# Patient Record
Sex: Female | Born: 1935
Health system: Southern US, Community
[De-identification: ages and names within clinical notes are randomized; demographics above are authoritative.]

## PROBLEM LIST (undated history)

## (undated) DIAGNOSIS — M1990 Unspecified osteoarthritis, unspecified site: Secondary | ICD-10-CM

## (undated) DIAGNOSIS — Z87442 Personal history of urinary calculi: Secondary | ICD-10-CM

## (undated) DIAGNOSIS — N189 Chronic kidney disease, unspecified: Secondary | ICD-10-CM

## (undated) DIAGNOSIS — Z9889 Other specified postprocedural states: Secondary | ICD-10-CM

## (undated) DIAGNOSIS — F419 Anxiety disorder, unspecified: Secondary | ICD-10-CM

## (undated) DIAGNOSIS — I10 Essential (primary) hypertension: Secondary | ICD-10-CM

## (undated) HISTORY — DX: Unspecified osteoarthritis, unspecified site: M19.90

## (undated) HISTORY — PX: BREAST LUMPECTOMY: SHX2

## (undated) HISTORY — PX: OTHER SURGICAL HISTORY: SHX169

## (undated) HISTORY — PX: CYSTOSCOPY W/ STONE MANIPULATION: SHX1427

## (undated) HISTORY — DX: Chronic kidney disease, unspecified: N18.9

## (undated) HISTORY — PX: ABDOMINAL HYSTERECTOMY: SHX81

## (undated) HISTORY — DX: Anxiety disorder, unspecified: F41.9

## (undated) HISTORY — DX: Other specified postprocedural states: Z98.890

## (undated) HISTORY — DX: Essential (primary) hypertension: I10

---

## 1999-11-30 ENCOUNTER — Encounter: Payer: Self-pay | Admitting: Urology

## 1999-11-30 ENCOUNTER — Encounter: Admission: RE | Admit: 1999-11-30 | Discharge: 1999-11-30 | Payer: Self-pay | Admitting: Dermatology

## 1999-12-03 ENCOUNTER — Ambulatory Visit (HOSPITAL_COMMUNITY): Admission: RE | Admit: 1999-12-03 | Discharge: 1999-12-03 | Payer: Self-pay | Admitting: Urology

## 1999-12-03 ENCOUNTER — Encounter: Payer: Self-pay | Admitting: Urology

## 2001-02-10 ENCOUNTER — Encounter: Payer: Self-pay | Admitting: *Deleted

## 2001-02-10 ENCOUNTER — Ambulatory Visit (HOSPITAL_COMMUNITY): Admission: RE | Admit: 2001-02-10 | Discharge: 2001-02-10 | Payer: Self-pay | Admitting: *Deleted

## 2001-02-17 ENCOUNTER — Ambulatory Visit (HOSPITAL_COMMUNITY): Admission: RE | Admit: 2001-02-17 | Discharge: 2001-02-17 | Payer: Self-pay | Admitting: Internal Medicine

## 2001-03-24 ENCOUNTER — Encounter: Admission: RE | Admit: 2001-03-24 | Discharge: 2001-03-24 | Payer: Self-pay | Admitting: Urology

## 2001-03-24 ENCOUNTER — Encounter: Payer: Self-pay | Admitting: Urology

## 2001-06-20 ENCOUNTER — Ambulatory Visit (HOSPITAL_COMMUNITY): Admission: RE | Admit: 2001-06-20 | Discharge: 2001-06-20 | Payer: Self-pay | Admitting: Family Medicine

## 2001-06-20 ENCOUNTER — Encounter: Payer: Self-pay | Admitting: Family Medicine

## 2002-02-02 ENCOUNTER — Ambulatory Visit (HOSPITAL_COMMUNITY): Admission: RE | Admit: 2002-02-02 | Discharge: 2002-02-02 | Payer: Self-pay | Admitting: Internal Medicine

## 2002-02-02 ENCOUNTER — Encounter: Payer: Self-pay | Admitting: Internal Medicine

## 2002-06-20 ENCOUNTER — Ambulatory Visit (HOSPITAL_COMMUNITY): Admission: RE | Admit: 2002-06-20 | Discharge: 2002-06-20 | Payer: Self-pay | Admitting: Internal Medicine

## 2002-06-20 ENCOUNTER — Encounter: Payer: Self-pay | Admitting: Internal Medicine

## 2003-11-01 ENCOUNTER — Ambulatory Visit (HOSPITAL_COMMUNITY): Admission: RE | Admit: 2003-11-01 | Discharge: 2003-11-01 | Payer: Self-pay | Admitting: Family Medicine

## 2003-11-04 ENCOUNTER — Ambulatory Visit (HOSPITAL_COMMUNITY): Admission: RE | Admit: 2003-11-04 | Discharge: 2003-11-04 | Payer: Self-pay | Admitting: Family Medicine

## 2004-01-09 ENCOUNTER — Ambulatory Visit (HOSPITAL_COMMUNITY): Admission: RE | Admit: 2004-01-09 | Discharge: 2004-01-09 | Payer: Self-pay | Admitting: Internal Medicine

## 2004-05-03 ENCOUNTER — Emergency Department (HOSPITAL_COMMUNITY): Admission: EM | Admit: 2004-05-03 | Discharge: 2004-05-03 | Payer: Self-pay | Admitting: Emergency Medicine

## 2004-05-15 ENCOUNTER — Ambulatory Visit (HOSPITAL_BASED_OUTPATIENT_CLINIC_OR_DEPARTMENT_OTHER): Admission: RE | Admit: 2004-05-15 | Discharge: 2004-05-15 | Payer: Self-pay | Admitting: Urology

## 2004-05-15 ENCOUNTER — Ambulatory Visit (HOSPITAL_COMMUNITY): Admission: RE | Admit: 2004-05-15 | Discharge: 2004-05-15 | Payer: Self-pay | Admitting: Urology

## 2005-12-07 ENCOUNTER — Ambulatory Visit (HOSPITAL_COMMUNITY): Admission: RE | Admit: 2005-12-07 | Discharge: 2005-12-07 | Payer: Self-pay | Admitting: Family Medicine

## 2005-12-08 ENCOUNTER — Ambulatory Visit (HOSPITAL_COMMUNITY): Admission: RE | Admit: 2005-12-08 | Discharge: 2005-12-08 | Payer: Self-pay | Admitting: Family Medicine

## 2006-01-17 ENCOUNTER — Emergency Department (HOSPITAL_COMMUNITY): Admission: EM | Admit: 2006-01-17 | Discharge: 2006-01-17 | Payer: Self-pay | Admitting: Emergency Medicine

## 2006-03-30 DIAGNOSIS — Z9889 Other specified postprocedural states: Secondary | ICD-10-CM

## 2006-03-30 HISTORY — DX: Other specified postprocedural states: Z98.890

## 2006-04-13 ENCOUNTER — Ambulatory Visit (HOSPITAL_COMMUNITY): Admission: RE | Admit: 2006-04-13 | Discharge: 2006-04-13 | Payer: Self-pay | Admitting: Gastroenterology

## 2006-04-13 ENCOUNTER — Ambulatory Visit: Payer: Self-pay | Admitting: Gastroenterology

## 2006-12-20 ENCOUNTER — Emergency Department (HOSPITAL_COMMUNITY): Admission: EM | Admit: 2006-12-20 | Discharge: 2006-12-20 | Payer: Self-pay | Admitting: Emergency Medicine

## 2006-12-27 ENCOUNTER — Ambulatory Visit (HOSPITAL_COMMUNITY): Admission: RE | Admit: 2006-12-27 | Discharge: 2006-12-27 | Payer: Self-pay | Admitting: Family Medicine

## 2008-01-18 ENCOUNTER — Ambulatory Visit (HOSPITAL_COMMUNITY): Admission: RE | Admit: 2008-01-18 | Discharge: 2008-01-18 | Payer: Self-pay | Admitting: Family Medicine

## 2009-02-04 ENCOUNTER — Ambulatory Visit (HOSPITAL_COMMUNITY): Admission: RE | Admit: 2009-02-04 | Discharge: 2009-02-04 | Payer: Self-pay | Admitting: Family Medicine

## 2010-02-06 ENCOUNTER — Ambulatory Visit (HOSPITAL_COMMUNITY): Admission: RE | Admit: 2010-02-06 | Discharge: 2010-02-06 | Payer: Self-pay | Admitting: Family Medicine

## 2011-01-15 NOTE — Op Note (Signed)
NAMEJIMIA, Claire Barnett                 ACCOUNT NO.:  [id number]   MEDICAL RECORD NO.:  [id number]          PATIENT TYPE:  AMB   LOCATION:  DAY                           FACILITY:  [facility]   PHYSICIAN:  Kassie Mends, M.D.      DATE OF BIRTH:  Sep 16, 1935   DATE OF PROCEDURE:  04/13/2006  DATE OF DISCHARGE:                                  PROCEDURE NOTE   PROCEDURE:  Colonoscopy.   INDICATION FOR EXAM:  [name] is a 75 year old female who has a first-  degree relative with colon cancer at an age less than 45.  She also has a  personal history of polyps.  Her last colonoscopy was in 2002.   MEDICATIONS:  1. Demerol 50 mg IV.  2. Versed 4 mg IV.  3. Phenergan 12.5 mg IV.   FINDINGS:  1. Rare sigmoid diverticulosis.  Otherwise, no polyps, inflammatory      changes, masses or vascular ectasia seen.  2. Normal retroflexed view of the rectum.   RECOMMENDATIONS:  1. Screening colonoscopy in 5 years.  2. Recommend high fiber diet.  She is given a handout on diverticulosis      and high fiber diet.  3. Follow with Dr. Phillips Odor.   PROCEDURE TECHNIQUE:  The physical exam was performed and informed consent  was obtained from the patient, after explaining all risks, benefits and  alternatives to the procedure.  The patient was connected to monitoring  device and placed in left lateral position.  Continuous oxygen was provided  by nasal cannula and IV medicine administered through an indwelling cannula.  After administration of sedation and a rectal exam, the  patient's rectum was intubated, and the scope was advanced, under direct  visualization to the cecum.  The scope was subsequently removed slowly,  carefully examining the color, texture, anatomy and integrity of the mucosa  on the way out.  The patient was recovered in the endoscopy suite, and  discharged to the floor in satisfactory condition.      Kassie Mends, M.D.  Electronically Signed     SM/MEDQ  D:  04/13/2006  T:   04/13/2006  Job:  [id number]   cc:   Corrie Mckusick, M.D.  Fax: [phone]

## 2011-01-15 NOTE — Op Note (Signed)
NAME:  Claire Barnett, Claire Barnett                           ACCOUNT NO.:  [id number]   MEDICAL RECORD NO.:  [id number]                   PATIENT TYPE:  AMB   LOCATION:  [facility]                                 FACILITY:  [facility]   PHYSICIAN:  Ronald L. Ovidio Hanger, M.D.           DATE OF BIRTH:  11-13-1935   DATE OF PROCEDURE:  05/15/2004  DATE OF DISCHARGE:                                 OPERATIVE REPORT   PREOPERATIVE DIAGNOSES:  Bilateral hydroureteronephrosis, right  ureterolithiasis.   POSTOPERATIVE DIAGNOSES:  Bilateral hydroureteronephrosis, right  ureterolithiasis.   OPERATION:  Cystourethroscopy, right ureteral stone extraction, bilateral  retrograde ureteropyelogram.   SURGEON:  Ronald L. Earlene Plater, M.D.   ANESTHESIA:  LMA.   ESTIMATED BLOOD LOSS:  Negligible.   TUBES:  None.   COMPLICATIONS:  None.   INDICATIONS FOR PROCEDURE:  [name] is a lovely 75 year old black female  who presented with right flank pain. She had undergone a CT scan on  September 4 which revealed a 7.5 x 4.7 mm right distal ureteral calculus  with moderate high grade obstruction.  She has had intermittent mild right  flank pain but no left flank pain and history of bilateral nephrolithiasis.  Bilateral renal ultrasound on May 12, 2004 revealed mild  hydroureteronephrosis bilaterally and with this situation of concern and  felt that the  above procedure was indicated.   DESCRIPTION OF PROCEDURE:  The patient was placed in supine position and  after proper LMA anesthesia was placed in the dorsal lithotomy position,  prepped and draped with Betadine in a sterile fashion.  Cystourethroscopy  was performed with a 22.5 [place] Olympus panendoscope. Utilizing the 12 and  70 degree lenses, the bladder was carefully inspected. It was smooth walled  and noted from the right ureteral orifices was a large yellowish protruding  stone.  The left ureteral orifice was noted to be essentially normal.  Under  direct  guidance, the stone was grasped with a grasping forceps and removed  totally intact and a hydronephrotic drop was noted from the right ureteral  orifice. The stone will be submitted for stone analysis.  Right retrograde  ureteropyelogram was performed with a 6 [place] open ended catheter and  entire collecting system on the right side noted to be full due to the stone  but there were no obstruction filling defects.  The left ureteral orifice  was then approached, it was noted to be somewhat small but the 6 [place] open  ended catheter was passed up to the kidney and a left retrograde  ureteropyelogram was performed and there was a relative UPJ obstruction on  the left side which appeared to be chronic and most likely was the cause for  the fullness on the ultrasound but there were no  ureteral abnormalities, obstruction, filling defects, etc.  Both systems  appeared to drain well. The bladder was drained, the panendoscope was  removed and the patient  was taken to the recovery room stable. It was felt  that ureteral stents were probably not indicated in this case.      RLD/MEDQ  D:  05/15/2004  T:  05/16/2004  Job:  [id number]

## 2011-04-01 ENCOUNTER — Encounter: Payer: Self-pay | Admitting: Gastroenterology

## 2011-04-12 ENCOUNTER — Other Ambulatory Visit (HOSPITAL_COMMUNITY): Payer: Self-pay | Admitting: Internal Medicine

## 2011-04-12 DIAGNOSIS — Z139 Encounter for screening, unspecified: Secondary | ICD-10-CM

## 2011-04-15 ENCOUNTER — Encounter: Payer: Self-pay | Admitting: Gastroenterology

## 2011-04-15 ENCOUNTER — Ambulatory Visit (INDEPENDENT_AMBULATORY_CARE_PROVIDER_SITE_OTHER): Payer: Medicare Other | Admitting: Gastroenterology

## 2011-04-15 VITALS — BP 140/79 | HR 71 | Temp 98.0°F | Ht 60.0 in | Wt 147.6 lb

## 2011-04-15 DIAGNOSIS — Z800 Family history of malignant neoplasm of digestive organs: Secondary | ICD-10-CM

## 2011-04-15 NOTE — Progress Notes (Signed)
Cc to PCP 

## 2011-04-15 NOTE — Progress Notes (Signed)
Primary Care Physician:  Kirk Ruths, MD Primary Gastroenterologist:  Dr. Darrick Penna   Chief Complaint  Patient presents with  . Colonoscopy    HPI:   Ms. Claire Barnett is a pleasant 75 year old female who appears younger than stated age. She presents today for an updated colonoscopy, last done in 2007. She has a personal history of polyps in the remote past, as well as 2 first-degree relatives with a hx of colon cancer diagnosed at age <50. She is doing well today. Denies abdominal pain, change in bowel habits, nausea or vomiting. She reports remote hx of brbpr. This has not happened recently.    Past Medical History  Diagnosis Date  . S/P colonoscopy August 2007    Rare sigmoid diverticulosis.  Otherwise, no polyps, inflammatory  . Hypertension   . Anxiety     Past Surgical History  Procedure Date  . Breast lumpectomy     benign  . Kidney stones     X 4 procedures  . Abdominal hysterectomy     Current Outpatient Prescriptions  Medication Sig Dispense Refill  . alprazolam (XANAX) 2 MG tablet Take 2 mg by mouth at bedtime as needed.        Marland Kitchen atenolol (TENORMIN) 50 MG tablet Take 50 mg by mouth daily.       . hydrochlorothiazide 25 MG tablet Take 25 mg by mouth daily.         Allergies as of 04/15/2011  . (No Known Allergies)    Family History  Problem Relation Age of Onset  . Colon cancer Sister     diagnosed late 53s.   . Colon cancer Mother     diagnosed in mid to late 81s. Deceased age 30    History   Social History  . Marital Status: Married    Spouse Name: N/A    Number of Children: N/A  . Years of Education: N/A   Occupational History  . retired     was a Neurosurgeon   Social History Main Topics  . Smoking status: Never Smoker   . Smokeless tobacco: Not on file  . Alcohol Use: No  . Drug Use: No  . Sexually Active: Not on file    Review of Systems: Gen: Denies any fever, chills, fatigue, weight loss, lack of appetite.  CV: Denies chest pain,  heart palpitations, peripheral edema, syncope.  Resp: Denies shortness of breath at rest or with exertion. Denies wheezing or cough.  GI: Denies dysphagia or odynophagia. Denies jaundice, hematemesis, fecal incontinence. GU : Denies urinary burning, urinary frequency, urinary hesitancy MS: Denies joint pain, muscle weakness, cramps, or limitation of movement.  Derm: Denies rash, itching, dry skin Psych: Denies depression, anxiety, memory loss, and confusion Heme: Denies bruising, bleeding, and enlarged lymph nodes.  Physical Exam: BP 140/79  Pulse 71  Temp(Src) 98 F (36.7 C) (Temporal)  Ht 5' (1.524 m)  Wt 147 lb 9.6 oz (66.951 kg)  BMI 28.83 kg/m2 General:   Alert and oriented. Pleasant and cooperative. Well-nourished and well-developed. Appears younger than stated age.  Head:  Normocephalic and atraumatic. Eyes:  Without icterus, sclera clear and conjunctiva pink.  Ears:  Normal auditory acuity. Nose:  No deformity, discharge,  or lesions. Mouth:  No deformity or lesions, oral mucosa pink.  Neck:  Supple, without mass or thyromegaly. Lungs:  Clear to auscultation bilaterally. No wheezes, rales, or rhonchi. No distress.  Heart:  S1, S2 present without murmurs appreciated.  Abdomen:  +BS, soft, non-tender and  non-distended. No HSM noted. No guarding or rebound. No masses appreciated.  Rectal:  Deferred  Msk:  Symmetrical without gross deformities. Normal posture. Extremities:  Without clubbing or edema. Neurologic:  Alert and  oriented x4;  grossly normal neurologically. Skin:  Intact without significant lesions or rashes. Cervical Nodes:  No significant cervical adenopathy. Psych:  Alert and cooperative. Normal mood and affect.

## 2011-04-15 NOTE — Patient Instructions (Signed)
We have set you up for a colonoscopy with Dr. Darrick Penna.  No changes to your medication currently.  Further recommendations after procedure is completed.   Happy early [place] Ouch!!!!

## 2011-04-15 NOTE — Assessment & Plan Note (Signed)
75 year old pleasant female who appears younger than stated age, presenting for updated colonoscopy. Last done in 2007. Family hx of colon cancer in 2 first-degree relatives, both diagnosed in late 17s, and a personal hx of polyps. She is doing well without any GI symptoms except for a remote hx of mild brbpr. No other issues reported.   Proceed with colonoscopy with Dr. Darrick Penna in the near future. The risks, benefits, and alternatives have been discussed in detail with the patient. They state understanding and desire to proceed.

## 2011-04-15 NOTE — Progress Notes (Signed)
Addended by: Jennings Books on: 04/15/2011 09:31 AM   Modules accepted: Orders

## 2011-04-20 ENCOUNTER — Ambulatory Visit (HOSPITAL_COMMUNITY)
Admission: RE | Admit: 2011-04-20 | Discharge: 2011-04-20 | Disposition: A | Discharge status: Home / Self Care | Payer: Medicare Other | Source: Ambulatory Visit | Attending: Internal Medicine | Admitting: Internal Medicine

## 2011-04-20 DIAGNOSIS — Z1231 Encounter for screening mammogram for malignant neoplasm of breast: Secondary | ICD-10-CM | POA: Insufficient documentation

## 2011-04-20 DIAGNOSIS — Z139 Encounter for screening, unspecified: Secondary | ICD-10-CM

## 2011-04-23 ENCOUNTER — Other Ambulatory Visit: Payer: Self-pay | Admitting: Internal Medicine

## 2011-04-23 DIAGNOSIS — R928 Other abnormal and inconclusive findings on diagnostic imaging of breast: Secondary | ICD-10-CM

## 2011-05-06 NOTE — Progress Notes (Signed)
PED SCOPE

## 2011-05-11 MED ORDER — SODIUM CHLORIDE 0.45 % IV SOLN: Freq: Once | INTRAVENOUS | Status: DC

## 2011-05-12 ENCOUNTER — Encounter (HOSPITAL_COMMUNITY): Payer: Self-pay | Admitting: *Deleted

## 2011-05-12 ENCOUNTER — Ambulatory Visit (HOSPITAL_COMMUNITY)
Admission: RE | Admit: 2011-05-12 | Discharge: 2011-05-12 | Disposition: A | Discharge status: Home / Self Care | Payer: Medicare Other | Source: Ambulatory Visit | Attending: Gastroenterology | Admitting: Gastroenterology

## 2011-05-12 ENCOUNTER — Encounter (HOSPITAL_COMMUNITY)
Admission: RE | Disposition: A | Discharge status: Home / Self Care | Payer: Self-pay | Source: Ambulatory Visit | Attending: Gastroenterology

## 2011-05-12 DIAGNOSIS — Z79899 Other long term (current) drug therapy: Secondary | ICD-10-CM | POA: Insufficient documentation

## 2011-05-12 DIAGNOSIS — Z860100 Personal history of colon polyps, unspecified: Secondary | ICD-10-CM | POA: Insufficient documentation

## 2011-05-12 DIAGNOSIS — K5730 Diverticulosis of large intestine without perforation or abscess without bleeding: Secondary | ICD-10-CM | POA: Insufficient documentation

## 2011-05-12 DIAGNOSIS — K648 Other hemorrhoids: Secondary | ICD-10-CM | POA: Insufficient documentation

## 2011-05-12 DIAGNOSIS — Z800 Family history of malignant neoplasm of digestive organs: Secondary | ICD-10-CM | POA: Insufficient documentation

## 2011-05-12 DIAGNOSIS — I10 Essential (primary) hypertension: Secondary | ICD-10-CM | POA: Insufficient documentation

## 2011-05-12 HISTORY — PX: COLONOSCOPY: SHX5424

## 2011-05-12 SURGERY — COLONOSCOPY
Anesthesia: Moderate Sedation

## 2011-05-12 MED ORDER — SODIUM CHLORIDE 0.9 % IJ SOLN
INTRAMUSCULAR | Status: AC
  Filled 2011-05-12: qty 20

## 2011-05-12 MED ORDER — PROMETHAZINE HCL 25 MG/ML IJ SOLN
INTRAMUSCULAR | Status: AC
  Filled 2011-05-12: qty 1

## 2011-05-12 MED ORDER — MEPERIDINE HCL 100 MG/ML IJ SOLN
INTRAMUSCULAR | Status: DC | PRN
  Administered 2011-05-12: 25 mg via INTRAVENOUS
  Administered 2011-05-12: 50 mg via INTRAVENOUS

## 2011-05-12 MED ORDER — MEPERIDINE HCL 100 MG/ML IJ SOLN
INTRAMUSCULAR | Status: AC
  Filled 2011-05-12: qty 2

## 2011-05-12 MED ORDER — PROMETHAZINE HCL 25 MG/ML IJ SOLN
INTRAMUSCULAR | Status: DC | PRN
  Administered 2011-05-12: 12.5 mg via INTRAVENOUS

## 2011-05-12 MED ORDER — MIDAZOLAM HCL 5 MG/5ML IJ SOLN
INTRAMUSCULAR | Status: AC
  Filled 2011-05-12: qty 10

## 2011-05-12 MED ORDER — MIDAZOLAM HCL 5 MG/5ML IJ SOLN
INTRAMUSCULAR | Status: DC | PRN
  Administered 2011-05-12 (×2): 2 mg via INTRAVENOUS

## 2011-05-12 NOTE — Interval H&P Note (Signed)
History and Physical Interval Note:   05/12/2011   10:47 AM   Claire Barnett  has presented today for surgery, with the diagnosis of SURV.  The various methods of treatment have been discussed with the patient and family. After consideration of risks, benefits and other options for treatment, the patient has consented to  Procedure(s): COLONOSCOPY as a surgical intervention .  I have reviewed the patients' chart and labs.  Questions were answered to the patient's satisfaction.     [name]  MD

## 2011-05-12 NOTE — H&P (Signed)
  In epic 8/16

## 2011-05-19 ENCOUNTER — Ambulatory Visit (HOSPITAL_COMMUNITY)
Admission: RE | Admit: 2011-05-19 | Discharge: 2011-05-19 | Disposition: A | Discharge status: Home / Self Care | Payer: Medicare Other | Source: Ambulatory Visit | Attending: Internal Medicine | Admitting: Internal Medicine

## 2011-05-19 ENCOUNTER — Other Ambulatory Visit (HOSPITAL_COMMUNITY): Payer: Self-pay | Admitting: Internal Medicine

## 2011-05-19 ENCOUNTER — Encounter (HOSPITAL_COMMUNITY): Payer: Self-pay | Admitting: Gastroenterology

## 2011-05-19 DIAGNOSIS — R928 Other abnormal and inconclusive findings on diagnostic imaging of breast: Secondary | ICD-10-CM

## 2011-05-19 DIAGNOSIS — N63 Unspecified lump in unspecified breast: Secondary | ICD-10-CM | POA: Insufficient documentation

## 2011-05-27 ENCOUNTER — Other Ambulatory Visit (HOSPITAL_COMMUNITY): Payer: Self-pay | Admitting: Internal Medicine

## 2011-05-27 DIAGNOSIS — Z139 Encounter for screening, unspecified: Secondary | ICD-10-CM

## 2011-06-02 ENCOUNTER — Ambulatory Visit (HOSPITAL_COMMUNITY)
Admission: RE | Admit: 2011-06-02 | Discharge: 2011-06-02 | Disposition: A | Discharge status: Home / Self Care | Payer: Medicare Other | Source: Ambulatory Visit | Attending: Internal Medicine | Admitting: Internal Medicine

## 2011-06-02 DIAGNOSIS — Z139 Encounter for screening, unspecified: Secondary | ICD-10-CM

## 2011-06-02 DIAGNOSIS — Z780 Asymptomatic menopausal state: Secondary | ICD-10-CM | POA: Insufficient documentation

## 2011-06-02 DIAGNOSIS — Z13820 Encounter for screening for osteoporosis: Secondary | ICD-10-CM | POA: Insufficient documentation

## 2012-06-14 ENCOUNTER — Other Ambulatory Visit (HOSPITAL_COMMUNITY): Payer: Self-pay | Admitting: Physician Assistant

## 2012-06-14 DIAGNOSIS — N6089 Other benign mammary dysplasias of unspecified breast: Secondary | ICD-10-CM

## 2012-06-28 ENCOUNTER — Ambulatory Visit (HOSPITAL_COMMUNITY)
Admission: RE | Admit: 2012-06-28 | Discharge: 2012-06-28 | Disposition: A | Discharge status: Home / Self Care | Payer: Medicare Other | Source: Ambulatory Visit | Attending: Physician Assistant | Admitting: Physician Assistant

## 2012-06-28 ENCOUNTER — Other Ambulatory Visit (HOSPITAL_COMMUNITY): Payer: Self-pay | Admitting: "Endocrinology

## 2012-06-28 DIAGNOSIS — N6009 Solitary cyst of unspecified breast: Secondary | ICD-10-CM | POA: Insufficient documentation

## 2012-06-28 DIAGNOSIS — N6089 Other benign mammary dysplasias of unspecified breast: Secondary | ICD-10-CM

## 2012-06-28 DIAGNOSIS — Z09 Encounter for follow-up examination after completed treatment for conditions other than malignant neoplasm: Secondary | ICD-10-CM | POA: Insufficient documentation

## 2012-06-28 DIAGNOSIS — E0590 Thyrotoxicosis, unspecified without thyrotoxic crisis or storm: Secondary | ICD-10-CM

## 2012-06-29 ENCOUNTER — Encounter (HOSPITAL_COMMUNITY): Payer: Medicare Other

## 2012-06-30 ENCOUNTER — Encounter (HOSPITAL_COMMUNITY): Payer: Medicare Other

## 2012-07-06 ENCOUNTER — Encounter (HOSPITAL_COMMUNITY): Payer: Medicare Other

## 2012-07-07 ENCOUNTER — Encounter (HOSPITAL_COMMUNITY): Payer: Medicare Other

## 2012-07-12 ENCOUNTER — Encounter (HOSPITAL_COMMUNITY)
Admission: RE | Admit: 2012-07-12 | Discharge: 2012-07-12 | Disposition: A | Discharge status: Home / Self Care | Payer: Medicare Other | Source: Ambulatory Visit | Attending: "Endocrinology | Admitting: "Endocrinology

## 2012-07-12 ENCOUNTER — Encounter (HOSPITAL_COMMUNITY): Payer: Self-pay

## 2012-07-12 DIAGNOSIS — E0590 Thyrotoxicosis, unspecified without thyrotoxic crisis or storm: Secondary | ICD-10-CM

## 2012-07-12 MED ORDER — SODIUM IODIDE I 131 CAPSULE
10.0000 | Freq: Once | INTRAVENOUS | Status: AC | PRN
  Administered 2012-07-12: 17 via ORAL

## 2012-07-13 ENCOUNTER — Encounter (HOSPITAL_COMMUNITY)
Admission: RE | Admit: 2012-07-13 | Discharge: 2012-07-13 | Disposition: A | Discharge status: Home / Self Care | Payer: Medicare Other | Source: Ambulatory Visit | Attending: "Endocrinology | Admitting: "Endocrinology

## 2012-07-13 ENCOUNTER — Encounter (HOSPITAL_COMMUNITY): Payer: Self-pay

## 2012-07-13 DIAGNOSIS — G479 Sleep disorder, unspecified: Secondary | ICD-10-CM | POA: Insufficient documentation

## 2012-07-13 DIAGNOSIS — R002 Palpitations: Secondary | ICD-10-CM | POA: Insufficient documentation

## 2012-07-13 DIAGNOSIS — E0520 Thyrotoxicosis with toxic multinodular goiter without thyrotoxic crisis or storm: Secondary | ICD-10-CM | POA: Insufficient documentation

## 2012-07-13 DIAGNOSIS — R5381 Other malaise: Secondary | ICD-10-CM | POA: Insufficient documentation

## 2012-07-13 MED ORDER — SODIUM PERTECHNETATE TC 99M INJECTION
10.0000 | Freq: Once | INTRAVENOUS | Status: AC | PRN
  Administered 2012-07-13: 9.4 via INTRAVENOUS

## 2012-07-25 ENCOUNTER — Other Ambulatory Visit (HOSPITAL_COMMUNITY): Payer: Self-pay | Admitting: "Endocrinology

## 2012-07-25 DIAGNOSIS — E0510 Thyrotoxicosis with toxic single thyroid nodule without thyrotoxic crisis or storm: Secondary | ICD-10-CM

## 2012-08-01 ENCOUNTER — Other Ambulatory Visit (HOSPITAL_COMMUNITY): Payer: Self-pay | Admitting: "Endocrinology

## 2012-08-01 ENCOUNTER — Ambulatory Visit (HOSPITAL_COMMUNITY)
Admission: RE | Admit: 2012-08-01 | Discharge: 2012-08-01 | Disposition: A | Discharge status: Home / Self Care | Payer: Medicare Other | Source: Ambulatory Visit | Attending: "Endocrinology | Admitting: "Endocrinology

## 2012-08-01 ENCOUNTER — Encounter (HOSPITAL_COMMUNITY)
Admission: RE | Admit: 2012-08-01 | Discharge: 2012-08-01 | Disposition: A | Discharge status: Home / Self Care | Payer: Medicare Other | Source: Ambulatory Visit | Attending: "Endocrinology | Admitting: "Endocrinology

## 2012-08-01 DIAGNOSIS — R220 Localized swelling, mass and lump, head: Secondary | ICD-10-CM | POA: Insufficient documentation

## 2012-08-01 DIAGNOSIS — E0510 Thyrotoxicosis with toxic single thyroid nodule without thyrotoxic crisis or storm: Secondary | ICD-10-CM

## 2012-08-01 DIAGNOSIS — R221 Localized swelling, mass and lump, neck: Secondary | ICD-10-CM

## 2012-08-01 DIAGNOSIS — E0520 Thyrotoxicosis with toxic multinodular goiter without thyrotoxic crisis or storm: Secondary | ICD-10-CM | POA: Insufficient documentation

## 2012-08-01 NOTE — Progress Notes (Signed)
Lidocaine 2%         2mL injected                 Bilateral thyroid biopsies performed  

## 2012-08-03 ENCOUNTER — Encounter (HOSPITAL_COMMUNITY): Payer: Self-pay

## 2012-08-03 ENCOUNTER — Encounter (HOSPITAL_COMMUNITY)
Admission: RE | Admit: 2012-08-03 | Discharge: 2012-08-03 | Disposition: A | Discharge status: Home / Self Care | Payer: Medicare Other | Source: Ambulatory Visit | Attending: "Endocrinology | Admitting: "Endocrinology

## 2012-08-03 DIAGNOSIS — E0580 Other thyrotoxicosis without thyrotoxic crisis or storm: Secondary | ICD-10-CM | POA: Insufficient documentation

## 2012-08-03 MED ORDER — SODIUM IODIDE I 131 CAPSULE
30.0000 | Freq: Once | INTRAVENOUS | Status: AC | PRN
  Administered 2012-08-03: 29.4 via ORAL

## 2013-01-19 ENCOUNTER — Encounter (HOSPITAL_COMMUNITY): Payer: Self-pay | Admitting: Pharmacy Technician

## 2013-01-19 NOTE — Patient Instructions (Addendum)
Your procedure is scheduled on: 02/01/2013   Report to [facility] at  720    AM.  Call this number if you have problems the morning of surgery: [phone]   Do not eat food or drink liquids :After Midnight.      Take these medicines the morning of surgery with A SIP OF WATER: xanax,tenormin,hydrochloarthiazide   Do not wear jewelry, make-up or nail polish.  Do not wear lotions, powders, or perfumes.   Do not shave 48 hours prior to surgery.  Do not bring valuables to the hospital.  Contacts, dentures or bridgework may not be worn into surgery.  Leave suitcase in the car. After surgery it may be brought to your room.  For patients admitted to the hospital, checkout time is 11:00 AM the day of discharge.   Patients discharged the day of surgery will not be allowed to drive home.  :     Please read over the following fact sheets that you were given: Coughing and Deep Breathing, Surgical Site Infection Prevention, Anesthesia Post-op Instructions and Care and Recovery After Surgery    Cataract A cataract is a clouding of the lens of the eye. When a lens becomes cloudy, vision is reduced based on the degree and nature of the clouding. Many cataracts reduce vision to some degree. Some cataracts make people more near-sighted as they develop. Other cataracts increase glare. Cataracts that are ignored and become worse can sometimes look white. The white color can be seen through the pupil. CAUSES   Aging. However, cataracts may occur at any age, even in newborns.   Certain drugs.   Trauma to the eye.   Certain diseases such as diabetes.   Specific eye diseases such as chronic inflammation inside the eye or a sudden attack of a rare form of glaucoma.   Inherited or acquired medical problems.  SYMPTOMS   Gradual, progressive drop in vision in the affected eye.   Severe, rapid visual loss. This most often happens when trauma is the cause.  DIAGNOSIS  To detect a cataract, an eye doctor  examines the lens. Cataracts are best diagnosed with an exam of the eyes with the pupils enlarged (dilated) by drops.  TREATMENT  For an early cataract, vision may improve by using different eyeglasses or stronger lighting. If that does not help your vision, surgery is the only effective treatment. A cataract needs to be surgically removed when vision loss interferes with your everyday activities, such as driving, reading, or watching TV. A cataract may also have to be removed if it prevents examination or treatment of another eye problem. Surgery removes the cloudy lens and usually replaces it with a substitute lens (intraocular lens, IOL).  At a time when both you and your doctor agree, the cataract will be surgically removed. If you have cataracts in both eyes, only one is usually removed at a time. This allows the operated eye to heal and be out of danger from any possible problems after surgery (such as infection or poor wound healing). In rare cases, a cataract may be doing damage to your eye. In these cases, your caregiver may advise surgical removal right away. The vast majority of people who have cataract surgery have better vision afterward. HOME CARE INSTRUCTIONS  If you are not planning surgery, you may be asked to do the following:  Use different eyeglasses.   Use stronger or brighter lighting.   Ask your eye doctor about reducing your medicine dose or  changing medicines if it is thought that a medicine caused your cataract. Changing medicines does not make the cataract go away on its own.   Become familiar with your surroundings. Poor vision can lead to injury. Avoid bumping into things on the affected side. You are at a higher risk for tripping or falling.   Exercise extreme care when driving or operating machinery.   Wear sunglasses if you are sensitive to bright light or experiencing problems with glare.  SEEK IMMEDIATE MEDICAL CARE IF:   You have a worsening or sudden vision  loss.   You notice redness, swelling, or increasing pain in the eye.   You have a fever.  Document Released: 08/16/2005 Document Revised: 08/05/2011 Document Reviewed: 04/09/2011 [facility] Patient Information 2012 [place].PATIENT INSTRUCTIONS POST-ANESTHESIA  IMMEDIATELY FOLLOWING SURGERY:  Do not drive or operate machinery for the first twenty four hours after surgery.  Do not make any important decisions for twenty four hours after surgery or while taking narcotic pain medications or sedatives.  If you develop intractable nausea and vomiting or a severe headache please notify your doctor immediately.  FOLLOW-UP:  Please make an appointment with your surgeon as instructed. You do not need to follow up with anesthesia unless specifically instructed to do so.  WOUND CARE INSTRUCTIONS (if applicable):  Keep a dry clean dressing on the anesthesia/puncture wound site if there is drainage.  Once the wound has quit draining you may leave it open to air.  Generally you should leave the bandage intact for twenty four hours unless there is drainage.  If the epidural site drains for more than 36-48 hours please call the anesthesia department.  QUESTIONS?:  Please feel free to call your physician or the hospital operator if you have any questions, and they will be happy to assist you.

## 2013-01-23 ENCOUNTER — Other Ambulatory Visit: Payer: Self-pay

## 2013-01-23 ENCOUNTER — Encounter (HOSPITAL_COMMUNITY)
Admission: RE | Admit: 2013-01-23 | Discharge: 2013-01-23 | Disposition: A | Discharge status: Home / Self Care | Payer: Medicare Other | Source: Ambulatory Visit | Attending: Ophthalmology | Admitting: Ophthalmology

## 2013-01-23 ENCOUNTER — Encounter (HOSPITAL_COMMUNITY): Payer: Self-pay

## 2013-01-23 HISTORY — DX: Personal history of urinary calculi: Z87.442

## 2013-01-23 LAB — BASIC METABOLIC PANEL
BUN: 15 mg/dL (ref 6–23)
CO2: 33 mEq/L — ABNORMAL HIGH (ref 19–32)
Calcium: 9.9 mg/dL (ref 8.4–10.5)
Chloride: 104 mEq/L (ref 96–112)
Creatinine, Ser: 1.03 mg/dL (ref 0.50–1.10)
Glucose, Bld: 76 mg/dL (ref 70–99)

## 2013-01-23 LAB — HEMOGLOBIN AND HEMATOCRIT, BLOOD: HCT: 38 % (ref 36.0–46.0)

## 2013-01-31 MED ORDER — CYCLOPENTOLATE-PHENYLEPHRINE 0.2-1 % OP SOLN
OPHTHALMIC | Status: AC
  Filled 2013-01-31: qty 2

## 2013-01-31 MED ORDER — LIDOCAINE HCL (PF) 1 % IJ SOLN
INTRAMUSCULAR | Status: AC
  Filled 2013-01-31: qty 2

## 2013-01-31 MED ORDER — LIDOCAINE HCL 3.5 % OP GEL
OPHTHALMIC | Status: AC
  Filled 2013-01-31: qty 5

## 2013-01-31 MED ORDER — NEOMYCIN-POLYMYXIN-DEXAMETH 3.5-10000-0.1 OP OINT
TOPICAL_OINTMENT | OPHTHALMIC | Status: AC
  Filled 2013-01-31: qty 3.5

## 2013-01-31 MED ORDER — TETRACAINE HCL 0.5 % OP SOLN
OPHTHALMIC | Status: AC
  Filled 2013-01-31: qty 2

## 2013-01-31 MED ORDER — PHENYLEPHRINE HCL 2.5 % OP SOLN
OPHTHALMIC | Status: AC
  Filled 2013-01-31: qty 2

## 2013-02-01 ENCOUNTER — Encounter (HOSPITAL_COMMUNITY): Payer: Self-pay | Admitting: Anesthesiology

## 2013-02-01 ENCOUNTER — Encounter (HOSPITAL_COMMUNITY)
Admission: RE | Disposition: A | Discharge status: Home / Self Care | Payer: Self-pay | Source: Ambulatory Visit | Attending: Ophthalmology

## 2013-02-01 ENCOUNTER — Encounter (HOSPITAL_COMMUNITY): Payer: Self-pay | Admitting: *Deleted

## 2013-02-01 ENCOUNTER — Ambulatory Visit (HOSPITAL_COMMUNITY)
Admission: RE | Admit: 2013-02-01 | Discharge: 2013-02-01 | Disposition: A | Discharge status: Home / Self Care | Payer: Medicare Other | Source: Ambulatory Visit | Attending: Ophthalmology | Admitting: Ophthalmology

## 2013-02-01 ENCOUNTER — Ambulatory Visit (HOSPITAL_COMMUNITY): Payer: Medicare Other | Admitting: Anesthesiology

## 2013-02-01 DIAGNOSIS — Z01812 Encounter for preprocedural laboratory examination: Secondary | ICD-10-CM | POA: Insufficient documentation

## 2013-02-01 DIAGNOSIS — Z01810 Encounter for preprocedural cardiovascular examination: Secondary | ICD-10-CM | POA: Insufficient documentation

## 2013-02-01 DIAGNOSIS — H2510 Age-related nuclear cataract, unspecified eye: Secondary | ICD-10-CM | POA: Insufficient documentation

## 2013-02-01 DIAGNOSIS — I10 Essential (primary) hypertension: Secondary | ICD-10-CM | POA: Insufficient documentation

## 2013-02-01 HISTORY — PX: CATARACT EXTRACTION W/PHACO: SHX586

## 2013-02-01 SURGERY — PHACOEMULSIFICATION, CATARACT, WITH IOL INSERTION
Anesthesia: Monitor Anesthesia Care | Site: Eye | Laterality: Right | Wound class: Clean

## 2013-02-01 MED ORDER — LIDOCAINE HCL (PF) 1 % IJ SOLN
INTRAMUSCULAR | Status: DC | PRN
  Administered 2013-02-01: .5 mL

## 2013-02-01 MED ORDER — MIDAZOLAM HCL 2 MG/2ML IJ SOLN
1.0000 mg | INTRAMUSCULAR | Status: DC | PRN
  Administered 2013-02-01: 2 mg via INTRAVENOUS

## 2013-02-01 MED ORDER — NEOMYCIN-POLYMYXIN-DEXAMETH 0.1 % OP OINT
TOPICAL_OINTMENT | OPHTHALMIC | Status: DC | PRN
  Administered 2013-02-01: 1 via OPHTHALMIC

## 2013-02-01 MED ORDER — POVIDONE-IODINE 5 % OP SOLN
OPHTHALMIC | Status: DC | PRN
  Administered 2013-02-01: 1 via OPHTHALMIC

## 2013-02-01 MED ORDER — PHENYLEPHRINE HCL 2.5 % OP SOLN
1.0000 [drp] | OPHTHALMIC | Status: AC
  Administered 2013-02-01 (×3): 1 [drp] via OPHTHALMIC

## 2013-02-01 MED ORDER — MIDAZOLAM HCL 2 MG/2ML IJ SOLN
INTRAMUSCULAR | Status: AC
  Filled 2013-02-01: qty 2

## 2013-02-01 MED ORDER — BSS IO SOLN
INTRAOCULAR | Status: DC | PRN
  Administered 2013-02-01: 15 mL via INTRAOCULAR

## 2013-02-01 MED ORDER — PROVISC 10 MG/ML IO SOLN
INTRAOCULAR | Status: DC | PRN
  Administered 2013-02-01: 8.5 mg via INTRAOCULAR

## 2013-02-01 MED ORDER — EPINEPHRINE HCL 1 MG/ML IJ SOLN
INTRAOCULAR | Status: DC | PRN
  Administered 2013-02-01: 09:00:00

## 2013-02-01 MED ORDER — TRYPAN BLUE 0.06 % OP SOLN
OPHTHALMIC | Status: DC | PRN
  Administered 2013-02-01: 0.5 mL via INTRAOCULAR

## 2013-02-01 MED ORDER — TRYPAN BLUE 0.06 % OP SOLN
OPHTHALMIC | Status: AC
  Filled 2013-02-01: qty 0.5

## 2013-02-01 MED ORDER — EPINEPHRINE HCL 1 MG/ML IJ SOLN
INTRAMUSCULAR | Status: AC
  Filled 2013-02-01: qty 1

## 2013-02-01 MED ORDER — LACTATED RINGERS IV SOLN
INTRAVENOUS | Status: DC
  Administered 2013-02-01: 08:00:00 via INTRAVENOUS

## 2013-02-01 MED ORDER — CYCLOPENTOLATE-PHENYLEPHRINE 0.2-1 % OP SOLN
1.0000 [drp] | OPHTHALMIC | Status: AC
  Administered 2013-02-01 (×3): 1 [drp] via OPHTHALMIC

## 2013-02-01 MED ORDER — TETRACAINE HCL 0.5 % OP SOLN
1.0000 [drp] | OPHTHALMIC | Status: AC
  Administered 2013-02-01 (×3): 1 [drp] via OPHTHALMIC

## 2013-02-01 MED ORDER — LIDOCAINE HCL 3.5 % OP GEL
1.0000 "application " | Freq: Once | OPHTHALMIC | Status: AC
  Administered 2013-02-01: 1 via OPHTHALMIC

## 2013-02-01 SURGICAL SUPPLY — 32 items

## 2013-02-01 NOTE — Transfer of Care (Signed)
Immediate Anesthesia Transfer of Care Note  Patient: Claire Barnett  Procedure(s) Performed: Procedure(s) with comments: CATARACT EXTRACTION PHACO AND INTRAOCULAR LENS PLACEMENT (IOC) (Right) - CDE:25.80  Patient Location: Short Stay  Anesthesia Type:MAC  Level of Consciousness: awake, alert , oriented and patient cooperative  Airway & Oxygen Therapy: Patient Spontanous Breathing  Post-op Assessment: Report given to PACU RN, Post -op Vital signs reviewed and stable and Patient moving all extremities  Post vital signs: Reviewed and stable  Complications: No apparent anesthesia complications

## 2013-02-01 NOTE — H&P (Signed)
I have reviewed the H&P, the patient was re-examined, and I have identified no interval changes in medical condition and plan of care since the history and physical of record  

## 2013-02-01 NOTE — Anesthesia Preprocedure Evaluation (Signed)
Anesthesia Evaluation  Patient identified by MRN, date of birth, ID band Patient awake    Reviewed: Allergy & Precautions, H&P , NPO status , Patient's Chart, lab work & pertinent test results  Airway Mallampati: II TM Distance: >3 FB     Dental  (+) Partial Upper and Poor Dentition   Pulmonary neg pulmonary ROS,  breath sounds clear to auscultation        Cardiovascular hypertension, Pt. on medications Rhythm:Regular Rate:Normal     Neuro/Psych PSYCHIATRIC DISORDERS Anxiety    GI/Hepatic negative GI ROS,   Endo/Other    Renal/GU      Musculoskeletal   Abdominal   Peds  Hematology   Anesthesia Other Findings   Reproductive/Obstetrics                           Anesthesia Physical Anesthesia Plan  ASA: II  Anesthesia Plan: MAC   Post-op Pain Management:    Induction: Intravenous  Airway Management Planned: Nasal Cannula  Additional Equipment:   Intra-op Plan:   Post-operative Plan:   Informed Consent: I have reviewed the patients History and Physical, chart, labs and discussed the procedure including the risks, benefits and alternatives for the proposed anesthesia with the patient or authorized representative who has indicated his/her understanding and acceptance.     Plan Discussed with:   Anesthesia Plan Comments:         Anesthesia Quick Evaluation

## 2013-02-01 NOTE — Anesthesia Postprocedure Evaluation (Signed)
  Anesthesia Post-op Note  Patient: Claire Barnett  Procedure(s) Performed: Procedure(s) with comments: CATARACT EXTRACTION PHACO AND INTRAOCULAR LENS PLACEMENT (IOC) (Right) - CDE:25.80  Patient Location: Short Stay  Anesthesia Type:MAC  Level of Consciousness: awake, alert , oriented and patient cooperative  Airway and Oxygen Therapy: Patient Spontanous Breathing  Post-op Pain: none  Post-op Assessment: Post-op Vital signs reviewed, Patient's Cardiovascular Status Stable, Respiratory Function Stable, Patent Airway and Pain level controlled  Post-op Vital Signs: Reviewed and stable  Complications: No apparent anesthesia complications

## 2013-02-01 NOTE — Op Note (Signed)
Date of Admission: 02/01/2013  Date of Surgery:  02/01/2013  Pre-Op Dx: Cataract  Right  Eye  Post-Op Dx: Cortical Cataract  Right Eye,  Dx Code 366.17  Surgeon: Gemma Payor, M.D.  Assistants: None  Anesthesia: Topical with MAC  Indications: Painless, progressive loss of vision with compromise of daily activities.  Surgery: Cataract Extraction with Intraocular lens Implant Right Eye  Discription: The patient had dilating drops and viscous lidocaine placed into the left eye in the pre-op holding area. After transfer to the operating room, a time out was performed. The patient was then prepped and draped. Beginning with a 75 degree blade a paracentesis port was made at the surgeon's 2 o'clock position. The anterior chamber was then filled with 1% non-preserved lidocaine. This was followed by filling the anterior chamber with Provisc. A bent cystatome needle was used to create a continuous tear capsulotomy. Hydrodissection was performed with balanced salt solution on a Fine canula. The lens nucleus was then removed using the phacoemulsification handpiece. Residual cortex was removed with the I&A handpiece. The anterior chamber and capsular bag were refilled with Provisc. A posterior chamber intraocular lens was placed into the capsular bag with it's injector. The implant was positioned with the Kuglan hook. The Provisc was then removed from the anterior chamber and capsular bag with the I&A handpiece. Stromal hydration of the main incision and paracentesis port was performed with BSS on a Fine canula. The wounds were tested for leak which was negative. The patient tolerated the procedure well. There were no operative complications. The patient was then transferred to the recovery room in stable condition.  Complications: None  Specimen: None  EBL: None  Prosthetic device: B&L enVista, MX60, power 21.0D.

## 2013-02-02 ENCOUNTER — Encounter (HOSPITAL_COMMUNITY): Payer: Self-pay | Admitting: Ophthalmology

## 2013-09-07 ENCOUNTER — Emergency Department (HOSPITAL_COMMUNITY): Payer: Medicare HMO

## 2013-09-07 ENCOUNTER — Inpatient Hospital Stay (HOSPITAL_COMMUNITY)
Admission: EM | Admit: 2013-09-07 | Discharge: 2013-09-11 | DRG: 071 | Disposition: A | Discharge status: Home / Self Care | Payer: Medicare HMO | Attending: Family Medicine | Admitting: Family Medicine

## 2013-09-07 ENCOUNTER — Encounter (HOSPITAL_COMMUNITY): Payer: Self-pay | Admitting: Emergency Medicine

## 2013-09-07 DIAGNOSIS — D696 Thrombocytopenia, unspecified: Secondary | ICD-10-CM | POA: Diagnosis present

## 2013-09-07 DIAGNOSIS — J111 Influenza due to unidentified influenza virus with other respiratory manifestations: Secondary | ICD-10-CM | POA: Diagnosis present

## 2013-09-07 DIAGNOSIS — F411 Generalized anxiety disorder: Secondary | ICD-10-CM | POA: Diagnosis present

## 2013-09-07 DIAGNOSIS — Z87442 Personal history of urinary calculi: Secondary | ICD-10-CM

## 2013-09-07 DIAGNOSIS — I10 Essential (primary) hypertension: Secondary | ICD-10-CM | POA: Diagnosis present

## 2013-09-07 DIAGNOSIS — Z800 Family history of malignant neoplasm of digestive organs: Secondary | ICD-10-CM

## 2013-09-07 DIAGNOSIS — D649 Anemia, unspecified: Secondary | ICD-10-CM | POA: Diagnosis present

## 2013-09-07 DIAGNOSIS — E876 Hypokalemia: Secondary | ICD-10-CM | POA: Diagnosis present

## 2013-09-07 DIAGNOSIS — E870 Hyperosmolality and hypernatremia: Secondary | ICD-10-CM | POA: Diagnosis present

## 2013-09-07 DIAGNOSIS — F0390 Unspecified dementia without behavioral disturbance: Secondary | ICD-10-CM | POA: Diagnosis present

## 2013-09-07 DIAGNOSIS — G9340 Encephalopathy, unspecified: Principal | ICD-10-CM | POA: Diagnosis present

## 2013-09-07 DIAGNOSIS — F419 Anxiety disorder, unspecified: Secondary | ICD-10-CM | POA: Diagnosis present

## 2013-09-07 DIAGNOSIS — N390 Urinary tract infection, site not specified: Secondary | ICD-10-CM | POA: Diagnosis present

## 2013-09-07 DIAGNOSIS — T391X1A Poisoning by 4-Aminophenol derivatives, accidental (unintentional), initial encounter: Secondary | ICD-10-CM | POA: Diagnosis present

## 2013-09-07 LAB — COMPREHENSIVE METABOLIC PANEL
ALT: 27 U/L (ref 0–35)
AST: 35 U/L (ref 0–37)
Albumin: 3.8 g/dL (ref 3.5–5.2)
Alkaline Phosphatase: 36 U/L — ABNORMAL LOW (ref 39–117)
BUN: 15 mg/dL (ref 6–23)
CALCIUM: 9.4 mg/dL (ref 8.4–10.5)
CO2: 30 mEq/L (ref 19–32)
CREATININE: 0.78 mg/dL (ref 0.50–1.10)
Chloride: 100 mEq/L (ref 96–112)
GFR calc non Af Amer: 80 mL/min — ABNORMAL LOW (ref >90)
GLUCOSE: 155 mg/dL — AB (ref 70–99)
Potassium: 3.1 mEq/L — ABNORMAL LOW (ref 3.7–5.3)
SODIUM: 144 meq/L (ref 137–147)
TOTAL PROTEIN: 7.4 g/dL (ref 6.0–8.3)
Total Bilirubin: 0.8 mg/dL (ref 0.3–1.2)

## 2013-09-07 LAB — CBC WITH DIFFERENTIAL/PLATELET
BASOS ABS: 0 10*3/uL (ref 0.0–0.1)
Basophils Relative: 0 % (ref 0–1)
EOS ABS: 0 10*3/uL (ref 0.0–0.7)
EOS PCT: 0 % (ref 0–5)
HCT: 35.4 % — ABNORMAL LOW (ref 36.0–46.0)
Hemoglobin: 12.2 g/dL (ref 12.0–15.0)
Lymphocytes Relative: 14 % (ref 12–46)
Lymphs Abs: 1.1 10*3/uL (ref 0.7–4.0)
MCH: 31 pg (ref 26.0–34.0)
MCHC: 34.5 g/dL (ref 30.0–36.0)
MCV: 90.1 fL (ref 78.0–100.0)
Monocytes Absolute: 0.6 10*3/uL (ref 0.1–1.0)
Monocytes Relative: 8 % (ref 3–12)
Neutro Abs: 6 10*3/uL (ref 1.7–7.7)
Neutrophils Relative %: 78 % — ABNORMAL HIGH (ref 43–77)
PLATELETS: 134 10*3/uL — AB (ref 150–400)
RBC: 3.93 MIL/uL (ref 3.87–5.11)
RDW: 12.4 % (ref 11.5–15.5)
WBC: 7.7 10*3/uL (ref 4.0–10.5)

## 2013-09-07 LAB — URINALYSIS, ROUTINE W REFLEX MICROSCOPIC
Bilirubin Urine: NEGATIVE
GLUCOSE, UA: NEGATIVE mg/dL
Ketones, ur: 15 mg/dL — AB
LEUKOCYTES UA: NEGATIVE
Nitrite: NEGATIVE
PH: 6.5 (ref 5.0–8.0)
Specific Gravity, Urine: 1.025 (ref 1.005–1.030)
Urobilinogen, UA: 0.2 mg/dL (ref 0.0–1.0)

## 2013-09-07 LAB — LACTIC ACID, PLASMA: Lactic Acid, Venous: 1.3 mmol/L (ref 0.5–2.2)

## 2013-09-07 LAB — URINE MICROSCOPIC-ADD ON

## 2013-09-07 LAB — TROPONIN I: Troponin I: <0.3 ng/mL (ref <0.30)

## 2013-09-07 LAB — AMMONIA: AMMONIA: 39 umol/L (ref 11–60)

## 2013-09-07 MED ORDER — ACETAMINOPHEN 650 MG RE SUPP
650.0000 mg | Freq: Once | RECTAL | Status: AC
Start: 2013-09-07 — End: 2013-09-07
  Administered 2013-09-07: 650 mg via RECTAL

## 2013-09-07 MED ORDER — DEXTROSE 5 % IV SOLN
1.0000 g | INTRAVENOUS | Status: DC
  Administered 2013-09-07: 1 g via INTRAVENOUS
  Filled 2013-09-07 (×3): qty 10

## 2013-09-07 MED ORDER — IBUPROFEN 400 MG PO TABS
400.0000 mg | ORAL_TABLET | Freq: Once | ORAL | Status: AC
  Administered 2013-09-07: 400 mg via ORAL
  Filled 2013-09-07: qty 1

## 2013-09-07 MED ORDER — SODIUM CHLORIDE 0.9 % IV SOLN
INTRAVENOUS | Status: DC
  Administered 2013-09-07: 1000 mL via INTRAVENOUS

## 2013-09-07 MED ORDER — ACETAMINOPHEN 650 MG RE SUPP
RECTAL | Status: AC
  Filled 2013-09-07: qty 1

## 2013-09-07 MED ORDER — SODIUM CHLORIDE 0.9 % IV BOLUS (SEPSIS)
1000.0000 mL | Freq: Once | INTRAVENOUS | Status: AC
  Administered 2013-09-07: 1000 mL via INTRAVENOUS

## 2013-09-07 NOTE — ED Notes (Signed)
Returns from radiology, tremulous, scooting off the stretcher. Aware of place and person. Spit a large amt of sputum across the room, which hit the wall.

## 2013-09-07 NOTE — ED Provider Notes (Signed)
CSN: [id number]     Arrival date & time 09/07/13  0865 History  This chart was scribed for Toy Baker, MD by Ardelia Mems, ED Scribe. This patient was seen in room APA06/APA06 and the patient's care was started at 7:20 PM.   Chief Complaint  Patient presents with  . Altered Mental Status    The history is provided by the patient. The history is limited by the condition of the patient. No language interpreter was used.    LEVEL 5 CAVEAT (Altered Mental Status)  HPI Comments: Claire Barnett is a 78 y.o. Female with a history of HTN accompanied by relatives to the Emergency Department complaining of altered mental status onset today. Pt was diagnosed with the flu yesterday and she was given a cough syrup that contained Codeine and placed on tamiflu. She has seen by her MD today and was told she was taking too much of this medication today. She was also dx today with a UTI and placed on cipro. Per relatives pt has been lethargic, nauseated and shaking at times today.   Past Medical History  Diagnosis Date  . S/P colonoscopy August 2007    Rare sigmoid diverticulosis.  Otherwise, no polyps, inflammatory  . Hypertension   . Anxiety   . History of kidney stones    Past Surgical History  Procedure Laterality Date  . Kidney stones      X 4 procedures  . Abdominal hysterectomy    . Colonoscopy  05/12/2011    Procedure: COLONOSCOPY;  Surgeon: Arlyce Harman, MD;  Location: [facility];  Service: Endoscopy;  Laterality: N/A;  10:30AM  . Breast lumpectomy Right     benign  . Cystoscopy w/ stone manipulation      x3  . Cataract extraction w/phaco Right 02/01/2013    Procedure: CATARACT EXTRACTION PHACO AND INTRAOCULAR LENS PLACEMENT (IOC);  Surgeon: Gemma Payor, MD;  Location: [facility];  Service: Ophthalmology;  Laterality: Right;  CDE:25.80   Family History  Problem Relation Age of Onset  . Colon cancer Sister     diagnosed late 43s.   . Colon cancer Mother     diagnosed in mid to late  82s. Deceased age 58   History  Substance Use Topics  . Smoking status: Never Smoker   . Smokeless tobacco: Not on file  . Alcohol Use: No   OB History   Grav Para Term Preterm Abortions TAB SAB Ect Mult Living                 Review of Systems  Unable to perform ROS: Mental status change    Allergies  Review of patient's allergies indicates no known allergies.  Home Medications   Current Outpatient Rx  Name  Route  Sig  Dispense  Refill  . alprazolam (XANAX) 2 MG tablet   Oral   Take 2 mg by mouth at bedtime as needed for sleep.          Marland Kitchen atenolol (TENORMIN) 50 MG tablet   Oral   Take 50 mg by mouth every morning.          . chlorpheniramine-HYDROcodone (TUSSIONEX) 10-8 MG/5ML LQCR   Oral   Take 5 mLs by mouth 2 (two) times daily as needed. For cough         . cholecalciferol (VITAMIN D) 1000 UNITS tablet   Oral   Take 1,000 Units by mouth daily.         Marland Kitchen  ciprofloxacin (CIPRO) 500 MG tablet   Oral   Take 500 mg by mouth 2 (two) times daily. 7 day course starting on 09/07/2013         . citalopram (CELEXA) 20 MG tablet   Oral   Take 20 mg by mouth every morning.         . hydrochlorothiazide 25 MG tablet   Oral   Take 25 mg by mouth every morning.          . meclizine (ANTIVERT) 25 MG tablet   Oral   Take 25 mg by mouth 3 (three) times daily as needed for dizziness.         . naproxen sodium (ALEVE) 220 MG tablet   Oral   Take 220-440 mg by mouth daily as needed (for pain).         Marland Kitchen TAMIFLU 75 MG capsule   Oral   Take 75 mg by mouth daily. 10 day course starting on 09/03/13          Triage Vitals: BP 161/91  Pulse 101  Temp(Src) 98.9 F (37.2 C) (Oral)  Resp 16  SpO2 90%  Physical Exam  Nursing note and vitals reviewed. Constitutional: She is oriented to person, place, and time. She appears well-developed and well-nourished.  Non-toxic appearance. No distress.  Lethargic. Arousable to deep stimulation.  HENT:  Head:  Normocephalic and atraumatic.  Eyes: Conjunctivae, EOM and lids are normal. Pupils are equal, round, and reactive to light.  Neck: Normal range of motion. Neck supple. No tracheal deviation present. No mass present.  Cardiovascular: Normal rate, regular rhythm and normal heart sounds.  Exam reveals no gallop.   No murmur heard. Pulmonary/Chest: Effort normal and breath sounds normal. No stridor. No respiratory distress. She has no decreased breath sounds. She has no wheezes. She has no rhonchi. She has no rales.  No signs of respiratory distress.  Abdominal: Soft. Normal appearance and bowel sounds are normal. She exhibits no distension. There is no tenderness. There is no rebound and no CVA tenderness.  Musculoskeletal: Normal range of motion. She exhibits no edema and no tenderness.  Neurological: She is alert and oriented to person, place, and time. She has normal strength. No cranial nerve deficit or sensory deficit. GCS eye subscore is 4. GCS verbal subscore is 5. GCS motor subscore is 6.  Withdraws to pain in all 4 extremities. Strength is 5/5.  Skin: Skin is warm and dry. No abrasion and no rash noted.  Psychiatric: She has a normal mood and affect. Her speech is normal and behavior is normal.    ED Course  Procedures (including critical care time)  DIAGNOSTIC STUDIES: Oxygen Saturation is 90% on RA, low by my interpretation.    COORDINATION OF CARE: 7:30 PM- Will obtain diagnostic lab work and radiology. Relatives advised of plan for treatment and relatives agrees.  Medications  0.9 %  sodium chloride infusion (not administered)  sodium chloride 0.9 % bolus 1,000 mL (0 mLs Intravenous Stopped 09/07/13 2015)  acetaminophen (TYLENOL) suppository 650 mg (650 mg Rectal Given 09/07/13 1944)   Labs Review Labs Reviewed  CBC WITH DIFFERENTIAL - Abnormal; Notable for the following:    HCT 35.4 (*)    Platelets 134 (*)    Neutrophils Relative % 78 (*)    All other components within  normal limits  COMPREHENSIVE METABOLIC PANEL - Abnormal; Notable for the following:    Potassium 3.1 (*)    Glucose, Bld 155 (*)  Alkaline Phosphatase 36 (*)    GFR calc non Af Amer 80 (*)    All other components within normal limits  CULTURE, BLOOD (ROUTINE X 2)  CULTURE, BLOOD (ROUTINE X 2)  URINE CULTURE  TROPONIN I  LACTIC ACID, PLASMA  AMMONIA  URINALYSIS, ROUTINE W REFLEX MICROSCOPIC   Imaging Review Dg Chest 2 View  09/07/2013   CLINICAL DATA:  Altered mental status.  EXAM: CHEST  2 VIEW  COMPARISON:  December 20, 2006.  FINDINGS: Stable mild cardiomegaly. Osteophyte formation of thoracic spine is noted. No pleural effusion or pneumothorax is noted. No acute pulmonary disease is noted.  IMPRESSION: No acute cardiopulmonary abnormality seen.   Electronically Signed   By: Roque LiasJames  Green M.D.   On: 09/07/2013 20:19   Ct Head Wo Contrast  09/07/2013   CLINICAL DATA:  Lethargy  EXAM: CT HEAD WITHOUT CONTRAST  TECHNIQUE: Contiguous axial images were obtained from the base of the skull through the vertex without intravenous contrast.  COMPARISON:  12/20/2006  FINDINGS: No skull fracture is noted. No intracranial hemorrhage, mass effect or midline shift. Mild cerebral atrophy. No intra or extra-axial fluid collection. No acute cortical infarction. No mass lesion is noted on this unenhanced scan. Paranasal sinuses and mastoid air cells are unremarkable.  IMPRESSION: No acute intracranial abnormality.  No acute cortical infarction.   Electronically Signed   By: Natasha MeadLiviu  Pop M.D.   On: 09/07/2013 20:28    EKG Interpretation    Date/Time:  Friday September 07 2013 19:42:44 EST Ventricular Rate:  100 PR Interval:  156 QRS Duration: 70 QT Interval:  382 QTC Calculation: 492 R Axis:   7 Text Interpretation:  Normal sinus rhythm T wave abnormality, consider inferolateral ischemia Abnormal ECG When compared with ECG of 23-Jan-2013 09:20, Vent. rate has increased BY  45 BPM T wave inversion less evident  in Lateral leads QT has lengthened Confirmed by Freida BusmanALLEN  MD, Tylerjames Hoglund ([id number]) on 09/07/2013 8:12:17 PM            MDM  No diagnosis found.  10:07 PM Patient started on Rocephin for her urinary tract infection. She had been given Tylenol for fever and mental status had greatly improved. She now has more rigors and I will give her Motrin. I suspect that she has combination of influenza as well as a UTI. She has a normal lactic acid. Do not believe that she is septic. Do not believe that she has meningitis. She will be admitted by the hospitalist  I personally performed the services described in this documentation, which was scribed in my presence. The recorded information has been reviewed and is accurate.     Toy BakerAnthony T Alitza Cowman, MD 09/07/13 [phone]

## 2013-09-07 NOTE — H&P (Signed)
PATIENT DETAILS Name: Claire Barnett Age: 78 y.o. Sex: female Date of Birth: February 24, 1936 Admit Date: 09/07/2013 WUJ:WJXBJ,YNWGNFAO J., MD   CHIEF COMPLAINT:  Altered mental status  HPI: Claire Barnett is a 78 y.o. female with a Past Medical History of hypertension, anxiety with depression, recently diagnosed with influenza by her PCP and started on Tamiflu and Tussionex, diagnosed with UTI today by PCP and started on ciprofloxacin who presents today with the above noted complaint. Patient history of present illness goes back to this past weekend when she started having confusion, fever and worsening cough. She apparently went to her PCPs office on Monday, and was tested positive for the flu. She was then started on Tamiflu and Tussionex. Patient's family claims that, after taking the Tussionex that the patient's mental status worsened. She continued to have intermittent fever. Please note that this fever is subjective, patient has been managing her temperature. She again went back to her PCP today, and was told she had a UTI and was started on ciprofloxacin. However since her mental status continued to worsen, patient presented to the emergency room for further evaluation. On further questioning of the patient's husband, patient has been also having tremors. Her confusion has also been with some mild hallucinations. Her niece at bedside, suggest patient may have some some amount of mild dementia at baseline. Patient is now being admitted for further evaluation and treatment. Please note, above history of present illness is mostly obtained from the patient's husband, and niece at bedside. Patient is only able to give some amount of history. From the history obtained, there is no headache, no chest pain, no shortness of breath, no nausea, vomiting, no diarrhea or any abdominal pain. On questioning, patient denies any dysuria.   ALLERGIES:  No Known Allergies  PAST MEDICAL HISTORY: Past Medical  History  Diagnosis Date  . S/P colonoscopy August 2007    Rare sigmoid diverticulosis.  Otherwise, no polyps, inflammatory  . Hypertension   . Anxiety   . History of kidney stones     PAST SURGICAL HISTORY: Past Surgical History  Procedure Laterality Date  . Kidney stones      X 4 procedures  . Abdominal hysterectomy    . Colonoscopy  05/12/2011    Procedure: COLONOSCOPY;  Surgeon: Arlyce Harman, MD;  Location: [facility];  Service: Endoscopy;  Laterality: N/A;  10:30AM  . Breast lumpectomy Right     benign  . Cystoscopy w/ stone manipulation      x3  . Cataract extraction w/phaco Right 02/01/2013    Procedure: CATARACT EXTRACTION PHACO AND INTRAOCULAR LENS PLACEMENT (IOC);  Surgeon: Gemma Payor, MD;  Location: [facility];  Service: Ophthalmology;  Laterality: Right;  CDE:25.80    MEDICATIONS AT HOME: Prior to Admission medications   Medication Sig Start Date End Date Taking? Authorizing Provider  alprazolam Prudy Feeler) 2 MG tablet Take 2 mg by mouth at bedtime as needed for sleep.    Yes Historical Provider, MD  atenolol (TENORMIN) 50 MG tablet Take 50 mg by mouth every morning.  04/12/11  Yes Historical Provider, MD  chlorpheniramine-HYDROcodone (TUSSIONEX) 10-8 MG/5ML LQCR Take 5 mLs by mouth 2 (two) times daily as needed. For cough 09/03/13  Yes Historical Provider, MD  cholecalciferol (VITAMIN D) 1000 UNITS tablet Take 1,000 Units by mouth daily.   Yes Historical Provider, MD  ciprofloxacin (CIPRO) 500 MG tablet Take 500 mg by mouth 2 (two) times daily. 7 day course starting on 09/07/2013 09/07/13  Yes Historical Provider, MD  citalopram (CELEXA) 20 MG tablet Take 20 mg by mouth every morning.   Yes Historical Provider, MD  hydrochlorothiazide 25 MG tablet Take 25 mg by mouth every morning.  04/12/11  Yes Historical Provider, MD  meclizine (ANTIVERT) 25 MG tablet Take 25 mg by mouth 3 (three) times daily as needed for dizziness.   Yes Historical Provider, MD  naproxen sodium (ALEVE) 220 MG  tablet Take 220-440 mg by mouth daily as needed (for pain).   Yes Historical Provider, MD  TAMIFLU 75 MG capsule Take 75 mg by mouth daily. 10 day course starting on 09/03/13 09/03/13  Yes Historical Provider, MD    FAMILY HISTORY: Family History  Problem Relation Age of Onset  . Colon cancer Sister     diagnosed late 4750s.   . Colon cancer Mother     diagnosed in mid to late 5850s. Deceased age 76    SOCIAL HISTORY:  reports that she has never smoked. She does not have any smokeless tobacco history on file. She reports that she does not drink alcohol or use illicit drugs.  REVIEW OF SYSTEMS:  Constitutional:   No  weight loss, night sweats.  HEENT:    No headaches, Difficulty swallowing,Tooth/dental problems,Sore throat,  No sneezing, itching, ear ache  Cardio-vascular: No chest pain,  Orthopnea, PND, swelling in lower extremities, anasarca,  dizziness, palpitations  GI:  No heartburn, indigestion, abdominal pain, nausea, vomiting, diarrhea, change in bowel habits, loss of appetite  Resp: No shortness of breath with exertion or at rest. No wheezing.No chest wall deformity  Skin:  no rash or lesions.  GU:  no dysuria, change in color of urine, no urgency or frequency.  No flank pain.  Musculoskeletal: No joint pain or swelling.  No decreased range of motion.  No back pain.   PHYSICAL EXAM: Blood pressure 148/66, pulse 86, temperature 100.7 F (38.2 C), temperature source Rectal, resp. rate 13, SpO2 99.00%.  General appearance : Confused, but easily arousable. Follows most of my commands. Not in any distress. Speech Clear.  HEENT: Atraumatic and Normocephalic, pupils equally reactive to light and accomodation Neck: supple, no JVD. No cervical lymphadenopathy.  Chest:Good air entry bilaterally, no added sounds  CVS: S1 S2 regular, no murmurs.  Abdomen: Bowel sounds present, Non tender and not distended with no gaurding, rigidity or rebound. Extremities: B/L Lower Ext shows  no edema, both legs are warm to touch Neurology: Non focal Skin:No Rash Wounds:N/A  LABS ON ADMISSION:   Recent Labs  09/07/13 1934  NA 144  K 3.1*  CL 100  CO2 30  GLUCOSE 155*  BUN 15  CREATININE 0.78  CALCIUM 9.4    Recent Labs  09/07/13 1934  AST 35  ALT 27  ALKPHOS 36*  BILITOT 0.8  PROT 7.4  ALBUMIN 3.8   No results found for this basename: LIPASE, AMYLASE,  in the last 72 hours  Recent Labs  09/07/13 1934  WBC 7.7  NEUTROABS 6.0  HGB 12.2  HCT 35.4*  MCV 90.1  PLT 134*    Recent Labs  09/07/13 1934  TROPONINI <0.30   No results found for this basename: DDIMER,  in the last 72 hours No components found with this basename: POCBNP,    RADIOLOGIC STUDIES ON ADMISSION: Dg Chest 2 View  09/07/2013   CLINICAL DATA:  Altered mental status.  EXAM: CHEST  2 VIEW  COMPARISON:  December 20, 2006.  FINDINGS: Stable mild cardiomegaly. Osteophyte formation  of thoracic spine is noted. No pleural effusion or pneumothorax is noted. No acute pulmonary disease is noted.  IMPRESSION: No acute cardiopulmonary abnormality seen.   Electronically Signed   By: Roque Lias M.D.   On: 09/07/2013 20:19   Ct Head Wo Contrast  09/07/2013   CLINICAL DATA:  Lethargy  EXAM: CT HEAD WITHOUT CONTRAST  TECHNIQUE: Contiguous axial images were obtained from the base of the skull through the vertex without intravenous contrast.  COMPARISON:  12/20/2006  FINDINGS: No skull fracture is noted. No intracranial hemorrhage, mass effect or midline shift. Mild cerebral atrophy. No intra or extra-axial fluid collection. No acute cortical infarction. No mass lesion is noted on this unenhanced scan. Paranasal sinuses and mastoid air cells are unremarkable.  IMPRESSION: No acute intracranial abnormality.  No acute cortical infarction.   Electronically Signed   By: Natasha Mead M.D.   On: 09/07/2013 20:28     EKG: Independently reviewed. Sinus rhythm-with a lot of artifacts given tremors. T wave inversions  in lateral leads. ASSESSMENT AND PLAN: Present on Admission:  . Encephalopathy acute - Suspected secondary to UTI, ongoing influenza, maybe with some contributions from codeine (on Tussionex)  - CT head negative, neck supple.  - Treat UTI with Rocephin, continue Tamiflu for influenza, hydrate and follow clinical course.  - If no clinical improvement, further workup will be contemplated.   Marland Kitchen UTI (lower urinary tract infection) - Follow urine culture, continue Rocephin that was started in in the emergency room.   . Influenza - Continue Tamiflu.   Marland Kitchen HTN (hypertension) - Cautiously continue with atenolol and HCTZ, holding parameters placed.   . Anxiety - Hold Xanax and Celexa for now.  . Hypokalemia - Secondary to HCTZ therapy. Replete and recheck in a.m.  Further plan will depend as patient's clinical course evolves and further radiologic and laboratory data become available. Patient will be monitored closely.   Above noted plan was discussed with husband and niece , they were in agreement.   DVT Prophylaxis: Prophylactic Lovenox   Code Status: Full Code  Total time spent for admission equals 45 minutes.  [facility] Pager [phone]  If 7PM-7AM, please contact night-coverage [email or web address] Password [email or web address] 09/07/2013, 10:51 PM

## 2013-09-07 NOTE — ED Notes (Signed)
She went to the MD today and they stated she had been taking too much of her cough med with codeine in it. Positive for the flu. Felt like throwing up this evening.

## 2013-09-07 NOTE — ED Notes (Signed)
Trembling almost stopped. Pt awake and talking with family

## 2013-09-08 DIAGNOSIS — J111 Influenza due to unidentified influenza virus with other respiratory manifestations: Secondary | ICD-10-CM

## 2013-09-08 LAB — COMPREHENSIVE METABOLIC PANEL
ALT: 23 U/L (ref 0–35)
AST: 35 U/L (ref 0–37)
Albumin: 3.5 g/dL (ref 3.5–5.2)
Alkaline Phosphatase: 33 U/L — ABNORMAL LOW (ref 39–117)
BILIRUBIN TOTAL: 0.7 mg/dL (ref 0.3–1.2)
BUN: 16 mg/dL (ref 6–23)
CHLORIDE: 105 meq/L (ref 96–112)
CO2: 26 mEq/L (ref 19–32)
Calcium: 8.9 mg/dL (ref 8.4–10.5)
Creatinine, Ser: 0.86 mg/dL (ref 0.50–1.10)
GFR calc non Af Amer: 64 mL/min — ABNORMAL LOW (ref >90)
GFR, EST AFRICAN AMERICAN: 75 mL/min — AB (ref >90)
GLUCOSE: 118 mg/dL — AB (ref 70–99)
POTASSIUM: 3.3 meq/L — AB (ref 3.7–5.3)
Sodium: 145 mEq/L (ref 137–147)
TOTAL PROTEIN: 7 g/dL (ref 6.0–8.3)

## 2013-09-08 LAB — CBC
HCT: 35.4 % — ABNORMAL LOW (ref 36.0–46.0)
HEMOGLOBIN: 12.1 g/dL (ref 12.0–15.0)
MCH: 30.9 pg (ref 26.0–34.0)
MCHC: 34.2 g/dL (ref 30.0–36.0)
MCV: 90.3 fL (ref 78.0–100.0)
Platelets: 131 10*3/uL — ABNORMAL LOW (ref 150–400)
RBC: 3.92 MIL/uL (ref 3.87–5.11)
RDW: 12.7 % (ref 11.5–15.5)
WBC: 6.6 10*3/uL (ref 4.0–10.5)

## 2013-09-08 LAB — MRSA PCR SCREENING: MRSA BY PCR: NEGATIVE

## 2013-09-08 LAB — GLUCOSE, CAPILLARY: GLUCOSE-CAPILLARY: 119 mg/dL — AB (ref 70–99)

## 2013-09-08 MED ORDER — POTASSIUM CHLORIDE CRYS ER 20 MEQ PO TBCR
40.0000 meq | EXTENDED_RELEASE_TABLET | Freq: Two times a day (BID) | ORAL | Status: DC
Start: 2013-09-08 — End: 2013-09-08
  Filled 2013-09-08: qty 2

## 2013-09-08 MED ORDER — HYDROCHLOROTHIAZIDE 25 MG PO TABS
25.0000 mg | ORAL_TABLET | Freq: Every morning | ORAL | Status: DC
  Filled 2013-09-08: qty 1

## 2013-09-08 MED ORDER — ACETAMINOPHEN 650 MG RE SUPP
650.0000 mg | Freq: Four times a day (QID) | RECTAL | Status: DC | PRN
Start: 2013-09-08 — End: 2013-09-11
  Administered 2013-09-08: 650 mg via RECTAL
  Filled 2013-09-08: qty 1

## 2013-09-08 MED ORDER — OSELTAMIVIR PHOSPHATE 30 MG PO CAPS
30.0000 mg | ORAL_CAPSULE | Freq: Two times a day (BID) | ORAL | Status: DC
  Filled 2013-09-08 (×5): qty 1

## 2013-09-08 MED ORDER — ONDANSETRON HCL 4 MG PO TABS
4.0000 mg | ORAL_TABLET | Freq: Four times a day (QID) | ORAL | Status: DC | PRN
Start: 2013-09-08 — End: 2013-09-11

## 2013-09-08 MED ORDER — SODIUM CHLORIDE 0.9 % IV SOLN
Freq: Once | INTRAVENOUS | Status: AC
  Administered 2013-09-08: 05:00:00 via INTRAVENOUS

## 2013-09-08 MED ORDER — LORAZEPAM 2 MG/ML IJ SOLN
0.5000 mg | Freq: Once | INTRAMUSCULAR | Status: AC
  Administered 2013-09-08: 0.5 mg via INTRAMUSCULAR

## 2013-09-08 MED ORDER — ALBUTEROL SULFATE (2.5 MG/3ML) 0.083% IN NEBU
2.5000 mg | INHALATION_SOLUTION | RESPIRATORY_TRACT | Status: DC | PRN
Start: 2013-09-08 — End: 2013-09-11

## 2013-09-08 MED ORDER — POTASSIUM CHLORIDE 10 MEQ/100ML IV SOLN
10.0000 meq | INTRAVENOUS | Status: AC
  Administered 2013-09-08 (×2): 10 meq via INTRAVENOUS
  Filled 2013-09-08: qty 100

## 2013-09-08 MED ORDER — OSELTAMIVIR PHOSPHATE 75 MG PO CAPS: 75.0000 mg | ORAL_CAPSULE | Freq: Every day | ORAL | Status: DC

## 2013-09-08 MED ORDER — ONDANSETRON HCL 4 MG/2ML IJ SOLN: 4.0000 mg | Freq: Four times a day (QID) | INTRAMUSCULAR | Status: DC | PRN

## 2013-09-08 MED ORDER — ALUM & MAG HYDROXIDE-SIMETH 200-200-20 MG/5ML PO SUSP: 30.0000 mL | Freq: Four times a day (QID) | ORAL | Status: DC | PRN

## 2013-09-08 MED ORDER — LORAZEPAM 2 MG/ML IJ SOLN
0.5000 mg | Freq: Once | INTRAMUSCULAR | Status: AC
  Administered 2013-09-08: 0.5 mg via INTRAMUSCULAR
  Filled 2013-09-08: qty 1

## 2013-09-08 MED ORDER — ATENOLOL 25 MG PO TABS
50.0000 mg | ORAL_TABLET | Freq: Every morning | ORAL | Status: DC
  Administered 2013-09-10 – 2013-09-11 (×2): 50 mg via ORAL
  Filled 2013-09-08 (×3): qty 2

## 2013-09-08 MED ORDER — POTASSIUM CHLORIDE CRYS ER 20 MEQ PO TBCR
40.0000 meq | EXTENDED_RELEASE_TABLET | Freq: Once | ORAL | Status: DC
  Filled 2013-09-08: qty 2

## 2013-09-08 MED ORDER — GUAIFENESIN-DM 100-10 MG/5ML PO SYRP
5.0000 mL | ORAL_SOLUTION | ORAL | Status: DC | PRN
  Administered 2013-09-10 – 2013-09-11 (×2): 5 mL via ORAL
  Filled 2013-09-08 (×2): qty 5

## 2013-09-08 MED ORDER — INFLUENZA VAC SPLIT QUAD 0.5 ML IM SUSP
0.5000 mL | INTRAMUSCULAR | Status: DC
  Filled 2013-09-08: qty 0.5

## 2013-09-08 MED ORDER — SODIUM CHLORIDE 0.9 % IV SOLN
INTRAVENOUS | Status: DC
  Administered 2013-09-08 (×3): via INTRAVENOUS

## 2013-09-08 MED ORDER — LORAZEPAM 2 MG/ML IJ SOLN
0.5000 mg | Freq: Four times a day (QID) | INTRAMUSCULAR | Status: DC | PRN
  Administered 2013-09-08 – 2013-09-10 (×4): 0.5 mg via INTRAVENOUS
  Filled 2013-09-08 (×5): qty 1

## 2013-09-08 MED ORDER — VITAMIN D 1000 UNITS PO TABS
1000.0000 [IU] | ORAL_TABLET | Freq: Every day | ORAL | Status: DC
  Administered 2013-09-10 – 2013-09-11 (×2): 1000 [IU] via ORAL
  Filled 2013-09-08 (×3): qty 1

## 2013-09-08 MED ORDER — ACETAMINOPHEN 325 MG PO TABS: 650.0000 mg | ORAL_TABLET | Freq: Four times a day (QID) | ORAL | Status: DC | PRN

## 2013-09-08 MED ORDER — DEXTROSE 5 % IV SOLN
1.0000 g | INTRAVENOUS | Status: DC
  Administered 2013-09-08 – 2013-09-09 (×2): 1 g via INTRAVENOUS
  Filled 2013-09-08 (×3): qty 10

## 2013-09-08 MED ORDER — ENOXAPARIN SODIUM 40 MG/0.4ML ~~LOC~~ SOLN
40.0000 mg | SUBCUTANEOUS | Status: DC
  Administered 2013-09-08: 40 mg via SUBCUTANEOUS
  Filled 2013-09-08: qty 0.4

## 2013-09-08 NOTE — Progress Notes (Signed)
Utilization review Completed Tashai Catino RN BSN   

## 2013-09-08 NOTE — Progress Notes (Signed)
[facility] PROGRESS NOTE  Claire Barnett [id number] DOB: [birth year] DOA: 09/07/2013 PCP: Cassell SmilesFUSCO,LAWRENCE J., MD  Summary: 78 year old woman presented with h/o confusion, hallucinations, recently diagnosed flu and UTI treated with Tamiflu, Cipro and Tussionex. Family suggested confusion developed with Tussionex. Admitted for UTI, flu, acute encephalopathy.  Assessment/Plan: 1. Acute encephalopathy. CT head negative. Likely secondary to Tussionex by history, Cipro also a strong possibility. Complicated by UTI and influenza. 2. Influenza. Afebrile, vitals stable. Complete Tamiflu course.  3. UTI. Urinalysis equivocal but patient was already on ciprofloxacin prior to admission. Followup culture. Empiric Rocephin. 4. Hypokalemia. 5. Possible dementia per chart.  6. Anxiety. This appears to be stable.    Monitor her mental status.  Empiric Rocephin for UTI. Followup culture.  Complete Tamiflu.  Replete potassium.  Clinical condition appears improved. Transfer to medical floor.  Pending studies:   Urine culture  Blood cultures  Code Status: full code  DVT prophylaxis: Lovenox Family Communication: none present Disposition Plan: home when improved  Brendia Sacksaniel Facundo Allemand, MD  [facility]  Pager [phone] If 7PM-7AM, please contact night-coverage at [email or web address], password [email or web address] 09/08/2013, 9:11 AM  LOS: 1 day    Consultants:    Procedures:    Antibiotics:  Rocephin 1/9 >>  HPI/Subjective: Feels fine, no pain, no n/v, no complaints. Some tremors overnight per RN. No seizure-like activity.  Objective: Filed Vitals:   09/08/13 0400 09/08/13 0500 09/08/13 0559 09/08/13 0700  BP: 169/76     Pulse: 104 108  117  Temp: 100.3 F (37.9 C)     TempSrc: Axillary     Resp: 20 23  25   Height:      Weight:   68.1 kg (150 lb 2.1 oz)   SpO2: 100% 98%  96%    Intake/Output Summary (Last 24 hours) at 09/08/13 0911 Last data filed at 09/08/13 0700  Gross per 24  hour  Intake  817.5 ml  Output      0 ml  Net  817.5 ml     Filed Weights   09/08/13 0000 09/08/13 0559  Weight: 64.9 kg (143 lb 1.3 oz) 68.1 kg (150 lb 2.1 oz)    Exam:   TM 100.7 RR 20s, hypertensive, 96% on 2L.  General: Appears calm and comfortable. Nontoxic.  Eyes: Pupils equal, round, react to light. Normal lids, irises.  ENT: Appears grossly unremarkable  Neck: No obvious abnormalities  Cardiovascular: Regular rate and rhythm. No murmur, rub or gallop.  Telemetry: Sinus rhythm, no arrhythmias  Respiratory: Clear to auscultation bilaterally. No wheezes, rales or rhonchi. Normal respiratory effort.  Abdomen: Positive bowel sounds, soft, nontender, nondistended.  Skin: No rash or induration seen.   Musculoskeletal: Follows commands, moves all extremities to command. Grossly normal tone and strength.  Psychiatric: Grossly normal mood and affect. Speech fluent and appropriate. Oriented to self, president, year, month. Not oriented to location.  Data Reviewed:  +800 since admission  K+ 3.3, CMP otherwise unremarkable  Lactic acid normal, troponin negative  Urinalysis equivocal but already on abx, UC pending  CXR and CT head negative  EKG on admission NSR, nonspecific T wave changes  Scheduled Meds: . atenolol  50 mg Oral q morning - 10a  . cefTRIAXone (ROCEPHIN)  IV  1 g Intravenous Q24H  . cholecalciferol  1,000 Units Oral Daily  . enoxaparin (LOVENOX) injection  40 mg Subcutaneous Q24H  . hydrochlorothiazide  25 mg Oral q morning - 10a  . [START ON 09/09/2013] influenza vac split quadrivalent PF  0.5 mL Intramuscular Tomorrow-1000  . oseltamivir  75 mg Oral Daily  . potassium chloride  40 mEq Oral Once   Continuous Infusions: . sodium chloride 150 mL/hr at 09/08/13 1610    Principal Problem:   Encephalopathy acute Active Problems:   UTI (lower urinary tract infection)   Influenza   HTN (hypertension)   Anxiety   Time spent 25  minutes

## 2013-09-08 NOTE — Progress Notes (Signed)
Husband into see patient this morning. Started to yell and became combative and was cursing about wanting to go home. Patient did not know where she was or what was going on, Dr. Irene LimboGoodrich informed and new orders noted.

## 2013-09-08 NOTE — Progress Notes (Signed)
CTSP by RN for seizure-like activity.  Numerous family members at bedside including husband, niece, sister.   On my arrival the patient was awake, alert with generalized tremors without general or focal seizure-like activity. She denied pain and denied complaints.  On exam  Neurologic: Cranial nerves grossly intact. No focal deficits noted. Strength in arms and legs symmetric, 5/5. Follows some commands, opens mouth to command. Speech fluent and clear.   Psychiatric: Alert, oriented to self, month, year, president. She recognized and correctly identified each family member in the room. Answers appropriately and briskly. Laughs inappropriately.  Cardiovascular regular rate and rhythm.  Respiratory clear to auscultation bilaterally with normal respiratory effort  Abdomen soft nontender nondistended.  Musculoskeletal excellent tone and strength in all extremities. No focal deficits.  During time of exam and interview the family she was  Very restless with constant moving of arms and legs but remained alert and talkative. No facial abnormalities noted.  In review of hospitalization data, had unremarkable complete metabolic panel except for mild hypokalemia this morning. Serum ammonia was normal. Blood sugars have been stable. Urinalysis was equivocal. CT of the head and chest x-ray negative.  Further history obtained from husband as well as niece. Niece has observed some "sundowning" for some time now and suspects developing dementia. The patient became ill approximately 7-8 days ago with some confusion. She was seen by PCP 1/5 and diagnosed with influenza and started on Tamiflu and Tussinex. She had some confusion before starting these medications but became much worse afterwards. The family discovered she been taking twice or more the prescribed amount of tussinex. For the last 5 days she has had hallucinations, confusion and shaking episodes at home intermittently but no focal deficits or  seizure activity. She is seen by PCP 1/9, diagnosed with bladder infection and started on Cipro, she took one dose.  In the emergency department she was noted by EDP to have shaking episodes and this was also described by ED RN in note where again she was able to converse with her family during the shaking.  A/P  Subacute encephalopathy with recurrent episodes of agitation and tremulous behavior. No evidence of seizures at this point. CT head negative. Differential is broad, but favor multifactorial process. Her history suggests symptoms began prior to meds with onset of flu. Additionally suspect adverse reaction to medications, especially over consumption of Tussionex. Tamiflu can also cause psychiatric disturbance and will be discontinued at this point. Additionally Cipro can have similar effects. I think is too early for her to be withdrawing from Celexa and Xanax. This is all probably complicated by UTI and all superimposed on suspected dementia. There is no evidence of stroke, she has no focal deficits and current issues have been intermittent by history for the past week (shaking, confusion).  Ativan PRN, treat agitation  Transfer to SDU  Treat suspected UTI.  Discussed above in detail with husband, niece and multiple family members at bedside.  Brendia Sacksaniel Goodrich, MD [facility] [phone]

## 2013-09-08 NOTE — Progress Notes (Signed)
I was called to the pt room by family upon arrival I found the pt extremely agitated with her IV pulled out. As I tried to calm the pt I witnessed seizure like jerking with the pt unable to open her eyes.  She appeared to be holding her breath He face was flush and hot.  This activity lasted about 30 seconds  Pt then relaxed and became verbal again although she remained agitated.  Dr Irene LimboGoodrich was notified  Ativan 0.5mg  was given IM per his order and a bed request to ICU was ordered by Dr Irene LimboGoodrich after his examination of the pt

## 2013-09-08 NOTE — Progress Notes (Signed)
Ativan was effective in relaxing patient, Dr. Irene LimboGoodrich informed and stated to monitor for a few hours.

## 2013-09-08 NOTE — Progress Notes (Signed)
md notified of decreased urinary output. New orders received and noted

## 2013-09-08 NOTE — Progress Notes (Signed)
Patient transferred to 300 department.

## 2013-09-09 LAB — URINE CULTURE
Colony Count: NO GROWTH
Culture: NO GROWTH

## 2013-09-09 LAB — BASIC METABOLIC PANEL
BUN: 15 mg/dL (ref 6–23)
CALCIUM: 8.5 mg/dL (ref 8.4–10.5)
CO2: 27 mEq/L (ref 19–32)
CREATININE: 0.8 mg/dL (ref 0.50–1.10)
Chloride: 112 mEq/L (ref 96–112)
GFR calc non Af Amer: 70 mL/min — ABNORMAL LOW (ref >90)
GFR, EST AFRICAN AMERICAN: 82 mL/min — AB (ref >90)
Glucose, Bld: 81 mg/dL (ref 70–99)
Potassium: 3.3 mEq/L — ABNORMAL LOW (ref 3.7–5.3)
Sodium: 150 mEq/L — ABNORMAL HIGH (ref 137–147)

## 2013-09-09 LAB — CBC
HCT: 32.6 % — ABNORMAL LOW (ref 36.0–46.0)
Hemoglobin: 10.9 g/dL — ABNORMAL LOW (ref 12.0–15.0)
MCH: 30.7 pg (ref 26.0–34.0)
MCHC: 33.4 g/dL (ref 30.0–36.0)
MCV: 91.8 fL (ref 78.0–100.0)
PLATELETS: 130 10*3/uL — AB (ref 150–400)
RBC: 3.55 MIL/uL — ABNORMAL LOW (ref 3.87–5.11)
RDW: 12.9 % (ref 11.5–15.5)
WBC: 7 10*3/uL (ref 4.0–10.5)

## 2013-09-09 LAB — MAGNESIUM: Magnesium: 1.7 mg/dL (ref 1.5–2.5)

## 2013-09-09 MED ORDER — SODIUM CHLORIDE 0.45 % IV SOLN
INTRAVENOUS | Status: DC
  Administered 2013-09-09: 11:00:00 via INTRAVENOUS

## 2013-09-09 MED ORDER — POTASSIUM CHLORIDE 10 MEQ/100ML IV SOLN
10.0000 meq | INTRAVENOUS | Status: AC
  Administered 2013-09-09 (×4): 10 meq via INTRAVENOUS
  Filled 2013-09-09: qty 100

## 2013-09-09 NOTE — Progress Notes (Signed)
Husband came into room, explained to him about his wife didn't want him there tonight and wanted to have the night by herself. Husband very polite and said ok and went out of room.

## 2013-09-09 NOTE — Progress Notes (Signed)
[facility] PROGRESS NOTE  Claire Barnett ZOX:[id number] DOB: Jun 02, 1936 DOA: 09/07/2013 PCP: Cassell Smiles., MD  Summary: 78 year old woman presented with h/o confusion, hallucinations, recently diagnosed flu and UTI treated with Tamiflu, Cipro and Tussinex. Family suggested confusion developed with Tussinex which patient overconsumed. Admitted for UTI, flu, acute encephalopathy.  Assessment/Plan: 1. Acute encephalopathy. Improved today but not completely resolved. Multifactorial--overuse of Tussinex, suspected UTI, possible ADR to Tamiflu, Cipro. CT head negative, no focal neurologic deficits. Likely exacerbated by underlying dementia. 2. UTI. Urinalysis was equivocal the patient was already on ciprofloxacin. Followup culture, continue empiric antibiotic therapy. 3. Hypokalemia. 4. Hypernatremia. Encourage fluid intake. IV fluids of possible. 5. Normocytic anemia. Likely dilutional. No bleeding. Follow clinically. 6. Mild thrombocytopenia stable since admission. 7. Influenza, treated with nearly 5 days of Tamiflu. Because of the possibility of ADR (can cause psychiatric manifestations) no further treatment. Suspected dementia by history with possible sundowning at home. 8. Anxiety. Stable. Ativan as needed.     clinically improved today although encephalopathy not completely resolved. Discussed with husband at bedside. Plan to continue treatment for suspected UTI, avoid narcotics, monitor clinical status.  Replete potassium  Encourage fluid intake, IV fluids.  Basic metabolic panel and CBC in the morning.  Pending studies:   Urine culture  Blood cultures  Code Status: full code  DVT prophylaxis: Lovenox Family Communication: none present Disposition Plan: home when improved  Brendia Sacks, MD  [facility]  Pager [phone] If 7PM-7AM, please contact night-coverage at [email or web address], password [facility] 09/09/2013, 7:35 AM  LOS: 2 days     Consultants:    Procedures:    Antibiotics:  Rocephin 1/9 >>  HPI/Subjective: No events charted overnight. Discussed with RN. No recurrent tremors. Patient still with odd affect but more compliant today. Refuses telemetry. Patient denies complaints. No pain. Husband at bedside.  Objective: Filed Vitals:   09/08/13 1300 09/08/13 2000 09/08/13 2155 09/08/13 2300  BP: 176/90   144/85  Pulse: 79  87 70  Temp: 97.5 F (36.4 C) 97.6 F (36.4 C)  98.5 F (36.9 C)  TempSrc: Oral Oral  Axillary  Resp: 20  20 20   Height:      Weight:      SpO2: 95%  96% 99%    Intake/Output Summary (Last 24 hours) at 09/09/13 0735 Last data filed at 09/09/13 0600  Gross per 24 hour  Intake   3500 ml  Output    450 ml  Net   3050 ml     Filed Weights   09/08/13 0000 09/08/13 0559  Weight: 64.9 kg (143 lb 1.3 oz) 68.1 kg (150 lb 2.1 oz)    Exam:   TM 98.5. Normotensive. Normal respiratory rate and heart rate. Minimal hypoxia.  General: Appears calm and comfortable.  Neurologic: Cranial nerves 2-12 intact. Speech fluent and clear. Grossly normal tone and strength upper and lower extremities with no focal neurologic deficits. Follows commands appropriately.  Psychiatric: odd affect. Oriented to self, hospital, month, year, president.  Cardiovascular: Regular rate and rhythm. No murmur, rub or gallop. No lower extremity edema.  Telemetry: Patient refuses  Respiratory: Clear to auscultation bilaterally. No wheezes, rales or rhonchi. Normal respiratory effort.  Abdomen: Soft, nontender, nondistended.  Data Reviewed:  Sodium 150, potassium 3.3, magnesium 1.7. Normal BUN and creatinine.  Hemoglobin has trended downwards with IV fluids 12.1 >> 10.9. Platelet count stable 130.  Scheduled Meds: . atenolol  50 mg Oral q morning - 10a  . cefTRIAXone (ROCEPHIN)  IV  1 g Intravenous Q24H  . cholecalciferol  1,000 Units Oral Daily  . influenza vac split quadrivalent PF  0.5 mL  Intramuscular Tomorrow-1000   Continuous Infusions: . sodium chloride 150 mL/hr at 09/09/13 0600    Principal Problem:   Encephalopathy acute Active Problems:   UTI (lower urinary tract infection)   Influenza   HTN (hypertension)   Anxiety   Time spent 25 minutes
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
## 2013-09-10 LAB — GLUCOSE, CAPILLARY: Glucose-Capillary: 82 mg/dL (ref 70–99)

## 2013-09-10 LAB — BASIC METABOLIC PANEL
BUN: 10 mg/dL (ref 6–23)
CALCIUM: 8.6 mg/dL (ref 8.4–10.5)
CHLORIDE: 106 meq/L (ref 96–112)
CO2: 25 meq/L (ref 19–32)
Creatinine, Ser: 0.7 mg/dL (ref 0.50–1.10)
GFR calc Af Amer: >90 mL/min (ref >90)
GFR calc non Af Amer: 83 mL/min — ABNORMAL LOW (ref >90)
GLUCOSE: 85 mg/dL (ref 70–99)
Potassium: 3.1 mEq/L — ABNORMAL LOW (ref 3.7–5.3)
Sodium: 144 mEq/L (ref 137–147)

## 2013-09-10 LAB — CBC
HEMATOCRIT: 31.3 % — AB (ref 36.0–46.0)
Hemoglobin: 11.4 g/dL — ABNORMAL LOW (ref 12.0–15.0)
MCH: 32.8 pg (ref 26.0–34.0)
MCHC: 36.4 g/dL — ABNORMAL HIGH (ref 30.0–36.0)
MCV: 89.9 fL (ref 78.0–100.0)
PLATELETS: 140 10*3/uL — AB (ref 150–400)
RBC: 3.48 MIL/uL — AB (ref 3.87–5.11)
RDW: 12.6 % (ref 11.5–15.5)
WBC: 5.6 10*3/uL (ref 4.0–10.5)

## 2013-09-10 MED ORDER — CITALOPRAM HYDROBROMIDE 20 MG PO TABS
20.0000 mg | ORAL_TABLET | Freq: Every day | ORAL | Status: DC
  Administered 2013-09-10 – 2013-09-11 (×2): 20 mg via ORAL
  Filled 2013-09-10 (×2): qty 1

## 2013-09-10 MED ORDER — POTASSIUM CHLORIDE CRYS ER 20 MEQ PO TBCR
40.0000 meq | EXTENDED_RELEASE_TABLET | Freq: Two times a day (BID) | ORAL | Status: AC
  Administered 2013-09-10 (×2): 40 meq via ORAL
  Filled 2013-09-10 (×2): qty 2

## 2013-09-10 MED ORDER — CEFUROXIME AXETIL 250 MG PO TABS
500.0000 mg | ORAL_TABLET | Freq: Two times a day (BID) | ORAL | Status: DC
  Administered 2013-09-10 – 2013-09-11 (×2): 500 mg via ORAL
  Filled 2013-09-10 (×2): qty 2

## 2013-09-10 MED ORDER — POTASSIUM CHLORIDE CRYS ER 20 MEQ PO TBCR: 40.0000 meq | EXTENDED_RELEASE_TABLET | Freq: Once | ORAL | Status: DC
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
## 2013-09-10 NOTE — Progress Notes (Signed)
PT TRANSFERING TO ROOM 304. PT ALERT AND ORIENTED. DENIES ANY DISCOMFORT. NSL PATENT. TRANSFER REPORT CALLED TO TAMIKA RN ON 300.
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
## 2013-09-11 LAB — GLUCOSE, CAPILLARY: Glucose-Capillary: 89 mg/dL (ref 70–99)

## 2013-09-11 MED ORDER — POTASSIUM CHLORIDE CRYS ER 20 MEQ PO TBCR
40.0000 meq | EXTENDED_RELEASE_TABLET | Freq: Three times a day (TID) | ORAL | Status: DC
  Administered 2013-09-11: 40 meq via ORAL
  Filled 2013-09-11: qty 2

## 2013-09-11 MED ORDER — CEFUROXIME AXETIL 500 MG PO TABS: 500.0000 mg | ORAL_TABLET | Freq: Two times a day (BID) | ORAL | Status: DC

## 2013-09-11 NOTE — Progress Notes (Signed)
Discharge instructions and prescriptions given, verbalized understanding, out in stable condition with staff via w/c. 

## 2013-09-11 NOTE — Progress Notes (Signed)
UR chart review completed.  

## 2013-09-11 NOTE — Progress Notes (Signed)
[facility] PROGRESS NOTE  Claire Barnett [id number] DOB: 10/14/1935 DOA: 09/07/2013 PCP: Cassell SmilesFUSCO,LAWRENCE J., MD  Summary: 78 year old woman presented with h/o confusion, hallucinations, recently diagnosed flu and UTI treated with Tamiflu, Cipro and Tussinex. Family suggested confusion developed with Tussinex which patient overconsumed. Admitted for UTI, flu, acute encephalopathy.  Assessment/Plan: 1. Acute encephalopathy. Resolved. Multifactorial--overuse of Tussinex, suspected UTI, possible ADR to Tamiflu, Cipro. CT head negative, no focal neurologic deficits. Likely exacerbated by underlying dementia.  2. UTI. Urinalysis was equivocal but the patient was already on ciprofloxacin.  3. Hypokalemia. Repleted. 4. Hypernatremia. Resolved. 5. Normocytic anemia. Likely dilutional. No bleeding. Stable. No further evaluation at this time. 6. Mild thrombocytopenia stable since admission. 7. Influenza, treated with nearly 5 days of Tamiflu. Because of the possibility of ADR (can cause psychiatric manifestations) no further treatment. Suspected dementia by history with possible sundowning at home. 8. Anxiety. Stable. Ativan as needed.  9. Social issues. As per social work. Patient plans to stay with her sister.    Finish empiric treatment for suspected UTI.  Discharge home today  Microbiology:   Urine culture no growth, final. Note the patient was on antibiotics prior to admission.  Blood cultures no growth to date  Brendia Sacksaniel Jheremy Boger, MD  [facility]  Pager [phone] If 7PM-7AM, please contact night-coverage at [email or web address], password [email or web address] 09/11/2013, 11:55 AM  LOS: 4 days   Consultants:  none  Procedures:  none  Antibiotics:  Rocephin 1/9 >> 1/11  Ceftin 1/12 >> 1/15  HPI/Subjective: Feels okay. Continues to improve.  Objective: Filed Vitals:   09/10/13 0530 09/10/13 0750 09/10/13 1151 09/10/13 2156  BP: 165/82 194/97 170/81 159/78  Pulse: 67 73 70 67   Temp: 98.4 F (36.9 C) 98.6 F (37 C) 98.3 F (36.8 C) 98.9 F (37.2 C)  TempSrc: Oral Oral Oral Oral  Resp:  20 20 19   Height:      Weight: 66.4 kg (146 lb 6.2 oz)     SpO2:  97% 93% 97%    Intake/Output Summary (Last 24 hours) at 09/11/13 1155 Last data filed at 09/11/13 0941  Gross per 24 hour  Intake    720 ml  Output    402 ml  Net    318 ml     Filed Weights   09/08/13 0000 09/08/13 0559 09/10/13 0530  Weight: 64.9 kg (143 lb 1.3 oz) 68.1 kg (150 lb 2.1 oz) 66.4 kg (146 lb 6.2 oz)    Exam:   Afebrile, vital signs stable. No hypoxia.  General: Appears calm and comfortable.  Psychiatric grossly normal mood and affect. Speech fluent and appropriate.  Cardiovascular: Regular rate and rhythm. No murmur, rub or gallop.  Respiratory: Clear to auscultation bilaterally. No wheezes, rales or rhonchi. Normal respiratory effort.  Neurologic grossly unremarkable  Data Reviewed:  No new data  Scheduled Meds: . atenolol  50 mg Oral q morning - 10a  . cefUROXime  500 mg Oral BID WC  . cholecalciferol  1,000 Units Oral Daily  . citalopram  20 mg Oral Daily  . influenza vac split quadrivalent PF  0.5 mL Intramuscular Tomorrow-1000  . potassium chloride  40 mEq Oral TID WC   Continuous Infusions:    Principal Problem:   Encephalopathy acute Active Problems:   UTI (lower urinary tract infection)   Influenza   HTN (hypertension)   Anxiety

## 2013-09-11 NOTE — Clinical Social Work Note (Signed)
Spoke w patient and sister, patient plans to go to sister's house.  CSW signing off as no further SW needs identified.  Santa GeneraAnne Cunningham, LCSW Clinical Social Worker [phone])
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
## 2013-09-12 LAB — CULTURE, BLOOD (ROUTINE X 2)
CULTURE: NO GROWTH
Culture: NO GROWTH

## 2014-04-17 ENCOUNTER — Ambulatory Visit (HOSPITAL_COMMUNITY)
Admission: RE | Admit: 2014-04-17 | Discharge: 2014-04-17 | Disposition: A | Discharge status: Home / Self Care | Payer: Medicare HMO | Source: Ambulatory Visit | Attending: Physician Assistant | Admitting: Physician Assistant

## 2014-04-17 ENCOUNTER — Other Ambulatory Visit (HOSPITAL_COMMUNITY): Payer: Self-pay | Admitting: Physician Assistant

## 2014-04-17 DIAGNOSIS — M76899 Other specified enthesopathies of unspecified lower limb, excluding foot: Secondary | ICD-10-CM

## 2014-04-17 DIAGNOSIS — M25559 Pain in unspecified hip: Secondary | ICD-10-CM | POA: Insufficient documentation

## 2014-04-17 DIAGNOSIS — M549 Dorsalgia, unspecified: Secondary | ICD-10-CM | POA: Insufficient documentation

## 2015-03-17 DIAGNOSIS — Z6827 Body mass index (BMI) 27.0-27.9, adult: Secondary | ICD-10-CM | POA: Diagnosis not present

## 2015-03-17 DIAGNOSIS — Z1389 Encounter for screening for other disorder: Secondary | ICD-10-CM | POA: Diagnosis not present

## 2015-03-17 DIAGNOSIS — E663 Overweight: Secondary | ICD-10-CM | POA: Diagnosis not present

## 2015-03-17 DIAGNOSIS — F419 Anxiety disorder, unspecified: Secondary | ICD-10-CM | POA: Diagnosis not present

## 2015-04-29 ENCOUNTER — Other Ambulatory Visit (HOSPITAL_COMMUNITY): Payer: Self-pay | Admitting: Physician Assistant

## 2015-04-29 DIAGNOSIS — Z1389 Encounter for screening for other disorder: Secondary | ICD-10-CM | POA: Diagnosis not present

## 2015-04-29 DIAGNOSIS — E782 Mixed hyperlipidemia: Secondary | ICD-10-CM | POA: Diagnosis not present

## 2015-04-29 DIAGNOSIS — R001 Bradycardia, unspecified: Secondary | ICD-10-CM | POA: Diagnosis not present

## 2015-04-29 DIAGNOSIS — Z6827 Body mass index (BMI) 27.0-27.9, adult: Secondary | ICD-10-CM | POA: Diagnosis not present

## 2015-04-29 DIAGNOSIS — E663 Overweight: Secondary | ICD-10-CM | POA: Diagnosis not present

## 2015-04-29 DIAGNOSIS — Z01411 Encounter for gynecological examination (general) (routine) with abnormal findings: Secondary | ICD-10-CM | POA: Diagnosis not present

## 2015-04-29 DIAGNOSIS — Z139 Encounter for screening, unspecified: Secondary | ICD-10-CM

## 2015-05-07 ENCOUNTER — Ambulatory Visit (HOSPITAL_COMMUNITY)
Admission: RE | Admit: 2015-05-07 | Discharge: 2015-05-07 | Disposition: A | Discharge status: Home / Self Care | Payer: Commercial Managed Care - HMO | Source: Ambulatory Visit | Attending: Physician Assistant | Admitting: Physician Assistant

## 2015-05-07 DIAGNOSIS — Z139 Encounter for screening, unspecified: Secondary | ICD-10-CM

## 2015-05-07 DIAGNOSIS — Z1231 Encounter for screening mammogram for malignant neoplasm of breast: Secondary | ICD-10-CM | POA: Insufficient documentation

## 2015-09-24 DIAGNOSIS — Z1389 Encounter for screening for other disorder: Secondary | ICD-10-CM | POA: Diagnosis not present

## 2015-09-24 DIAGNOSIS — F419 Anxiety disorder, unspecified: Secondary | ICD-10-CM | POA: Diagnosis not present

## 2015-09-24 DIAGNOSIS — E663 Overweight: Secondary | ICD-10-CM | POA: Diagnosis not present

## 2015-09-24 DIAGNOSIS — Z6828 Body mass index (BMI) 28.0-28.9, adult: Secondary | ICD-10-CM | POA: Diagnosis not present

## 2016-02-26 DIAGNOSIS — I10 Essential (primary) hypertension: Secondary | ICD-10-CM | POA: Diagnosis not present

## 2016-02-26 DIAGNOSIS — F419 Anxiety disorder, unspecified: Secondary | ICD-10-CM | POA: Diagnosis not present

## 2016-02-26 DIAGNOSIS — R7309 Other abnormal glucose: Secondary | ICD-10-CM | POA: Diagnosis not present

## 2016-02-26 DIAGNOSIS — E663 Overweight: Secondary | ICD-10-CM | POA: Diagnosis not present

## 2016-02-26 DIAGNOSIS — Z6827 Body mass index (BMI) 27.0-27.9, adult: Secondary | ICD-10-CM | POA: Diagnosis not present

## 2016-02-26 DIAGNOSIS — R946 Abnormal results of thyroid function studies: Secondary | ICD-10-CM | POA: Diagnosis not present

## 2016-03-30 ENCOUNTER — Encounter: Payer: Self-pay | Admitting: Gastroenterology

## 2016-05-11 DIAGNOSIS — Z6829 Body mass index (BMI) 29.0-29.9, adult: Secondary | ICD-10-CM | POA: Diagnosis not present

## 2016-05-11 DIAGNOSIS — I10 Essential (primary) hypertension: Secondary | ICD-10-CM | POA: Diagnosis not present

## 2016-05-11 DIAGNOSIS — R7309 Other abnormal glucose: Secondary | ICD-10-CM | POA: Diagnosis not present

## 2016-05-11 DIAGNOSIS — E782 Mixed hyperlipidemia: Secondary | ICD-10-CM | POA: Diagnosis not present

## 2016-05-11 DIAGNOSIS — Z23 Encounter for immunization: Secondary | ICD-10-CM | POA: Diagnosis not present

## 2016-05-11 DIAGNOSIS — Z1389 Encounter for screening for other disorder: Secondary | ICD-10-CM | POA: Diagnosis not present

## 2016-06-18 ENCOUNTER — Other Ambulatory Visit (HOSPITAL_COMMUNITY): Payer: Self-pay | Admitting: Family Medicine

## 2016-06-18 DIAGNOSIS — Z1231 Encounter for screening mammogram for malignant neoplasm of breast: Secondary | ICD-10-CM

## 2016-07-07 ENCOUNTER — Ambulatory Visit (HOSPITAL_COMMUNITY)
Admission: RE | Admit: 2016-07-07 | Discharge: 2016-07-07 | Disposition: A | Discharge status: Home / Self Care | Payer: Commercial Managed Care - HMO | Source: Ambulatory Visit | Attending: Family Medicine | Admitting: Family Medicine

## 2016-07-07 DIAGNOSIS — Z1231 Encounter for screening mammogram for malignant neoplasm of breast: Secondary | ICD-10-CM | POA: Insufficient documentation

## 2016-09-21 DIAGNOSIS — H524 Presbyopia: Secondary | ICD-10-CM | POA: Diagnosis not present

## 2016-10-18 DIAGNOSIS — J069 Acute upper respiratory infection, unspecified: Secondary | ICD-10-CM | POA: Diagnosis not present

## 2016-10-18 DIAGNOSIS — E782 Mixed hyperlipidemia: Secondary | ICD-10-CM | POA: Diagnosis not present

## 2016-10-18 DIAGNOSIS — Z6828 Body mass index (BMI) 28.0-28.9, adult: Secondary | ICD-10-CM | POA: Diagnosis not present

## 2016-10-18 DIAGNOSIS — I10 Essential (primary) hypertension: Secondary | ICD-10-CM | POA: Diagnosis not present

## 2016-12-07 DIAGNOSIS — Z0000 Encounter for general adult medical examination without abnormal findings: Secondary | ICD-10-CM | POA: Diagnosis not present

## 2016-12-07 DIAGNOSIS — Z1389 Encounter for screening for other disorder: Secondary | ICD-10-CM | POA: Diagnosis not present

## 2016-12-07 DIAGNOSIS — E782 Mixed hyperlipidemia: Secondary | ICD-10-CM | POA: Diagnosis not present

## 2016-12-07 DIAGNOSIS — Z6826 Body mass index (BMI) 26.0-26.9, adult: Secondary | ICD-10-CM | POA: Diagnosis not present

## 2016-12-07 DIAGNOSIS — F419 Anxiety disorder, unspecified: Secondary | ICD-10-CM | POA: Diagnosis not present

## 2016-12-07 DIAGNOSIS — E663 Overweight: Secondary | ICD-10-CM | POA: Diagnosis not present

## 2016-12-07 DIAGNOSIS — I10 Essential (primary) hypertension: Secondary | ICD-10-CM | POA: Diagnosis not present

## 2017-01-31 DIAGNOSIS — F419 Anxiety disorder, unspecified: Secondary | ICD-10-CM | POA: Diagnosis not present

## 2017-01-31 DIAGNOSIS — Z6828 Body mass index (BMI) 28.0-28.9, adult: Secondary | ICD-10-CM | POA: Diagnosis not present

## 2017-01-31 DIAGNOSIS — Z79899 Other long term (current) drug therapy: Secondary | ICD-10-CM | POA: Diagnosis not present

## 2017-01-31 DIAGNOSIS — I10 Essential (primary) hypertension: Secondary | ICD-10-CM | POA: Diagnosis not present

## 2017-01-31 DIAGNOSIS — Z1389 Encounter for screening for other disorder: Secondary | ICD-10-CM | POA: Diagnosis not present

## 2017-01-31 DIAGNOSIS — E782 Mixed hyperlipidemia: Secondary | ICD-10-CM | POA: Diagnosis not present

## 2017-04-11 DIAGNOSIS — Z0000 Encounter for general adult medical examination without abnormal findings: Secondary | ICD-10-CM | POA: Diagnosis not present

## 2017-04-11 DIAGNOSIS — E663 Overweight: Secondary | ICD-10-CM | POA: Diagnosis not present

## 2017-04-11 DIAGNOSIS — Z6828 Body mass index (BMI) 28.0-28.9, adult: Secondary | ICD-10-CM | POA: Diagnosis not present

## 2017-04-11 DIAGNOSIS — Z1389 Encounter for screening for other disorder: Secondary | ICD-10-CM | POA: Diagnosis not present

## 2017-07-25 DIAGNOSIS — E663 Overweight: Secondary | ICD-10-CM | POA: Diagnosis not present

## 2017-07-25 DIAGNOSIS — E782 Mixed hyperlipidemia: Secondary | ICD-10-CM | POA: Diagnosis not present

## 2017-07-25 DIAGNOSIS — R310 Gross hematuria: Secondary | ICD-10-CM | POA: Diagnosis not present

## 2017-07-25 DIAGNOSIS — Z1389 Encounter for screening for other disorder: Secondary | ICD-10-CM | POA: Diagnosis not present

## 2017-07-25 DIAGNOSIS — R946 Abnormal results of thyroid function studies: Secondary | ICD-10-CM | POA: Diagnosis not present

## 2017-07-25 DIAGNOSIS — R630 Anorexia: Secondary | ICD-10-CM | POA: Diagnosis not present

## 2017-07-25 DIAGNOSIS — Z6826 Body mass index (BMI) 26.0-26.9, adult: Secondary | ICD-10-CM | POA: Diagnosis not present

## 2017-07-25 DIAGNOSIS — R634 Abnormal weight loss: Secondary | ICD-10-CM | POA: Diagnosis not present

## 2017-07-25 DIAGNOSIS — N183 Chronic kidney disease, stage 3 (moderate): Secondary | ICD-10-CM | POA: Diagnosis not present

## 2017-07-25 DIAGNOSIS — E876 Hypokalemia: Secondary | ICD-10-CM | POA: Diagnosis not present

## 2017-07-25 DIAGNOSIS — F419 Anxiety disorder, unspecified: Secondary | ICD-10-CM | POA: Diagnosis not present

## 2017-07-25 DIAGNOSIS — R7301 Impaired fasting glucose: Secondary | ICD-10-CM | POA: Diagnosis not present

## 2017-07-25 DIAGNOSIS — N3001 Acute cystitis with hematuria: Secondary | ICD-10-CM | POA: Diagnosis not present

## 2017-07-25 DIAGNOSIS — E039 Hypothyroidism, unspecified: Secondary | ICD-10-CM | POA: Diagnosis not present

## 2017-07-25 DIAGNOSIS — I10 Essential (primary) hypertension: Secondary | ICD-10-CM | POA: Diagnosis not present

## 2017-08-05 DIAGNOSIS — R7301 Impaired fasting glucose: Secondary | ICD-10-CM | POA: Diagnosis not present

## 2017-08-05 DIAGNOSIS — E748 Other specified disorders of carbohydrate metabolism: Secondary | ICD-10-CM | POA: Diagnosis not present

## 2017-08-12 ENCOUNTER — Other Ambulatory Visit (HOSPITAL_COMMUNITY): Payer: Self-pay | Admitting: Internal Medicine

## 2017-08-12 DIAGNOSIS — R319 Hematuria, unspecified: Secondary | ICD-10-CM

## 2017-08-12 DIAGNOSIS — R634 Abnormal weight loss: Secondary | ICD-10-CM

## 2017-08-24 ENCOUNTER — Other Ambulatory Visit (HOSPITAL_COMMUNITY): Payer: Self-pay | Admitting: Physician Assistant

## 2017-08-24 DIAGNOSIS — R319 Hematuria, unspecified: Secondary | ICD-10-CM

## 2017-08-24 DIAGNOSIS — R634 Abnormal weight loss: Secondary | ICD-10-CM

## 2017-08-25 ENCOUNTER — Ambulatory Visit (HOSPITAL_COMMUNITY)
Admission: RE | Admit: 2017-08-25 | Discharge: 2017-08-25 | Disposition: A | Discharge status: Home / Self Care | Payer: Medicare HMO | Source: Ambulatory Visit | Attending: Physician Assistant | Admitting: Physician Assistant

## 2017-08-25 ENCOUNTER — Encounter (HOSPITAL_COMMUNITY): Payer: Self-pay

## 2017-09-09 ENCOUNTER — Ambulatory Visit (HOSPITAL_COMMUNITY)
Admission: RE | Admit: 2017-09-09 | Discharge: 2017-09-09 | Disposition: A | Discharge status: Home / Self Care | Payer: Medicare HMO | Source: Ambulatory Visit | Attending: Physician Assistant | Admitting: Physician Assistant

## 2017-09-09 DIAGNOSIS — N281 Cyst of kidney, acquired: Secondary | ICD-10-CM | POA: Diagnosis not present

## 2017-09-09 DIAGNOSIS — I700 Atherosclerosis of aorta: Secondary | ICD-10-CM | POA: Diagnosis not present

## 2017-09-09 DIAGNOSIS — R6881 Early satiety: Secondary | ICD-10-CM | POA: Diagnosis present

## 2017-09-09 DIAGNOSIS — R319 Hematuria, unspecified: Secondary | ICD-10-CM | POA: Diagnosis present

## 2017-09-09 DIAGNOSIS — R634 Abnormal weight loss: Secondary | ICD-10-CM

## 2017-09-09 DIAGNOSIS — N200 Calculus of kidney: Secondary | ICD-10-CM | POA: Insufficient documentation

## 2017-09-09 DIAGNOSIS — N289 Disorder of kidney and ureter, unspecified: Secondary | ICD-10-CM | POA: Insufficient documentation

## 2017-09-09 LAB — POCT I-STAT CREATININE: Creatinine, Ser: 1 mg/dL (ref 0.44–1.00)

## 2017-09-09 MED ORDER — IOPAMIDOL (ISOVUE-300) INJECTION 61%
125.0000 mL | Freq: Once | INTRAVENOUS | Status: AC | PRN
  Administered 2017-09-09: 125 mL via INTRAVENOUS

## 2018-01-03 DIAGNOSIS — E663 Overweight: Secondary | ICD-10-CM | POA: Diagnosis not present

## 2018-01-03 DIAGNOSIS — Z6827 Body mass index (BMI) 27.0-27.9, adult: Secondary | ICD-10-CM | POA: Diagnosis not present

## 2018-01-03 DIAGNOSIS — M5136 Other intervertebral disc degeneration, lumbar region: Secondary | ICD-10-CM | POA: Diagnosis not present

## 2018-01-03 DIAGNOSIS — M5410 Radiculopathy, site unspecified: Secondary | ICD-10-CM | POA: Diagnosis not present

## 2018-04-12 DIAGNOSIS — M545 Low back pain: Secondary | ICD-10-CM | POA: Diagnosis not present

## 2018-04-12 DIAGNOSIS — E663 Overweight: Secondary | ICD-10-CM | POA: Diagnosis not present

## 2018-04-12 DIAGNOSIS — R7309 Other abnormal glucose: Secondary | ICD-10-CM | POA: Diagnosis not present

## 2018-04-12 DIAGNOSIS — Z0001 Encounter for general adult medical examination with abnormal findings: Secondary | ICD-10-CM | POA: Diagnosis not present

## 2018-04-12 DIAGNOSIS — F419 Anxiety disorder, unspecified: Secondary | ICD-10-CM | POA: Diagnosis not present

## 2018-04-12 DIAGNOSIS — Z6827 Body mass index (BMI) 27.0-27.9, adult: Secondary | ICD-10-CM | POA: Diagnosis not present

## 2018-04-12 DIAGNOSIS — E039 Hypothyroidism, unspecified: Secondary | ICD-10-CM | POA: Diagnosis not present

## 2018-04-12 DIAGNOSIS — G4719 Other hypersomnia: Secondary | ICD-10-CM | POA: Diagnosis not present

## 2018-04-12 DIAGNOSIS — E785 Hyperlipidemia, unspecified: Secondary | ICD-10-CM | POA: Diagnosis not present

## 2018-04-12 DIAGNOSIS — Z1389 Encounter for screening for other disorder: Secondary | ICD-10-CM | POA: Diagnosis not present

## 2018-04-12 DIAGNOSIS — R946 Abnormal results of thyroid function studies: Secondary | ICD-10-CM | POA: Diagnosis not present

## 2018-04-12 DIAGNOSIS — I10 Essential (primary) hypertension: Secondary | ICD-10-CM | POA: Diagnosis not present

## 2018-04-20 ENCOUNTER — Other Ambulatory Visit (HOSPITAL_COMMUNITY): Payer: Self-pay | Admitting: Internal Medicine

## 2018-04-20 DIAGNOSIS — Z1231 Encounter for screening mammogram for malignant neoplasm of breast: Secondary | ICD-10-CM

## 2018-04-22 DIAGNOSIS — E782 Mixed hyperlipidemia: Secondary | ICD-10-CM | POA: Diagnosis not present

## 2018-04-26 ENCOUNTER — Other Ambulatory Visit (HOSPITAL_COMMUNITY): Payer: Self-pay | Admitting: Internal Medicine

## 2018-04-26 DIAGNOSIS — M549 Dorsalgia, unspecified: Secondary | ICD-10-CM

## 2018-05-03 ENCOUNTER — Ambulatory Visit (HOSPITAL_COMMUNITY)
Admission: RE | Admit: 2018-05-03 | Discharge: 2018-05-03 | Disposition: A | Discharge status: Home / Self Care | Payer: Medicare HMO | Source: Ambulatory Visit | Attending: Internal Medicine | Admitting: Internal Medicine

## 2018-05-03 DIAGNOSIS — Z1231 Encounter for screening mammogram for malignant neoplasm of breast: Secondary | ICD-10-CM

## 2018-05-03 DIAGNOSIS — M549 Dorsalgia, unspecified: Secondary | ICD-10-CM

## 2018-05-03 DIAGNOSIS — M4316 Spondylolisthesis, lumbar region: Secondary | ICD-10-CM | POA: Insufficient documentation

## 2018-05-03 DIAGNOSIS — M545 Low back pain: Secondary | ICD-10-CM | POA: Insufficient documentation

## 2018-06-05 DIAGNOSIS — H25013 Cortical age-related cataract, bilateral: Secondary | ICD-10-CM | POA: Diagnosis not present

## 2018-06-05 DIAGNOSIS — H524 Presbyopia: Secondary | ICD-10-CM | POA: Diagnosis not present

## 2018-06-27 DIAGNOSIS — Z6826 Body mass index (BMI) 26.0-26.9, adult: Secondary | ICD-10-CM | POA: Diagnosis not present

## 2018-06-27 DIAGNOSIS — E663 Overweight: Secondary | ICD-10-CM | POA: Diagnosis not present

## 2018-06-27 DIAGNOSIS — F419 Anxiety disorder, unspecified: Secondary | ICD-10-CM | POA: Diagnosis not present

## 2018-06-28 DIAGNOSIS — M545 Low back pain: Secondary | ICD-10-CM | POA: Diagnosis not present

## 2018-06-28 DIAGNOSIS — G8929 Other chronic pain: Secondary | ICD-10-CM | POA: Diagnosis not present

## 2018-06-28 DIAGNOSIS — M47816 Spondylosis without myelopathy or radiculopathy, lumbar region: Secondary | ICD-10-CM | POA: Diagnosis not present

## 2018-07-06 ENCOUNTER — Encounter (HOSPITAL_COMMUNITY): Payer: Self-pay

## 2018-07-06 ENCOUNTER — Ambulatory Visit (HOSPITAL_COMMUNITY): Payer: Medicare HMO | Attending: Anesthesiology

## 2018-07-06 ENCOUNTER — Other Ambulatory Visit: Payer: Self-pay

## 2018-07-06 DIAGNOSIS — M545 Low back pain, unspecified: Secondary | ICD-10-CM

## 2018-07-06 DIAGNOSIS — M6281 Muscle weakness (generalized): Secondary | ICD-10-CM | POA: Insufficient documentation

## 2018-07-06 DIAGNOSIS — G8929 Other chronic pain: Secondary | ICD-10-CM | POA: Diagnosis not present

## 2018-07-06 NOTE — Patient Instructions (Signed)
   Elbow Prop (Extension)   Prop body up on elbows for ____ seconds. Slowly lower it. Repeat ____ times. Do ____ sessions per day.

## 2018-07-06 NOTE — Therapy (Signed)
[facility] [address] [place], [place], [address] Phone: [phone]   Fax:  [phone]  Physical Therapy Evaluation  Patient Details  Name: Claire Barnett MRN: [id number] Date of Birth: [birth year] Referring Provider (PT): Odette Fraction, MD   Encounter Date: 07/06/2018  PT End of Session - 07/06/18 1002    Visit Number  1    Number of Visits  7    Date for PT Re-Evaluation  08/17/18    Authorization Type  [employer] Medicare HMO (no auth required, no visit limit, $40 co-pay)    Authorization Time Period  07/06/18 - 08/18/18    Authorization - Visit Number  1    Authorization - Number of Visits  7    PT Start Time  0905    PT Stop Time  0954    PT Time Calculation (min)  49 min    Activity Tolerance  Patient tolerated treatment well    Behavior During Therapy  [facility] for tasks assessed/performed       Past Medical History:  Diagnosis Date  . Anxiety   . History of kidney stones   . Hypertension   . S/P colonoscopy August 2007   Rare sigmoid diverticulosis.  Otherwise, no polyps, inflammatory    Past Surgical History:  Procedure Laterality Date  . ABDOMINAL HYSTERECTOMY    . BREAST LUMPECTOMY Right    benign  . CATARACT EXTRACTION W/PHACO Right 02/01/2013   Procedure: CATARACT EXTRACTION PHACO AND INTRAOCULAR LENS PLACEMENT (IOC);  Surgeon: Gemma Payor, MD;  Location: [facility] ORS;  Service: Ophthalmology;  Laterality: Right;  CDE:25.80  . COLONOSCOPY  05/12/2011   Procedure: COLONOSCOPY;  Surgeon: Arlyce Harman, MD;  Location: [facility] ENDO SUITE;  Service: Endoscopy;  Laterality: N/A;  10:30AM  . CYSTOSCOPY W/ STONE MANIPULATION     x3  . kidney stones     X 4 procedures    There were no vitals filed for this visit.   Subjective Assessment - 07/06/18 1008    Subjective  Patient reports she has back pain intermittently that seems to be aggravated by standing or walking for extended periods. She currently manages her pain with a "blue gell"  that she applies to her back and states laying down helps also. She is having some discomfort with laying on her sides at night and points to her lateral hip around the greater trochanter, stating that hurts. She reports turning over the the other side an layign on her back resolves her pain. She would like to have less pain in her back during the middle of the day when trying to complete activites around her home.    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  Yes    Pain Score  5     Pain Location  Back    Pain Orientation  Lower    Pain Descriptors / Indicators  Aching;Dull    Pain Type  Chronic pain    Pain Onset  More than a month ago    Pain Frequency  Intermittent    Aggravating Factors   prolonged walking or standing    Pain Relieving Factors  layign down on back, rest    Effect of Pain on Daily Activities  pain with activities but pushes through her tasks  [facility] PT Assessment - 07/06/18 0001      Assessment   Medical Diagnosis  Low Back Pain    Referring Provider (PT)  Odette Fraction, MD    Onset Date/Surgical Date  --   approximately 1 year ago   Hand Dominance  Right    Prior Therapy  none      Precautions   Precautions  None      Restrictions   Weight Bearing Restrictions  No      Balance Screen   Has the patient fallen in the past 6 months  No    Has the patient had a decrease in activity level because of a fear of falling?   No    Is the patient reluctant to leave their home because of a fear of falling?   No      Home Environment   Living Environment  Private residence    Living Arrangements  Spouse/significant other    Available Help at Discharge  Family   sisters live close by   Type of Home  House    Home Access  Stairs to enter    Entrance Stairs-Number of Steps  4   1 in front    Entrance Stairs-Rails  Can reach both    Home Layout  One level    Home Equipment  Grab bars - tub/shower    Additional Comments  Independent with all ADL's and mobility, never used AD previously      Prior Function   Level of Independence  Independent    Vocation  Retired    Leisure  Enjoys watching TV, listening to radio, gardening      Cognition   Overall Cognitive Status  Within Functional Limits for tasks assessed      Observation/Other Assessments   Focus on Therapeutic Outcomes (FOTO)   41% limited      ROM / Strength   AROM / PROM / Strength  AROM;Strength      AROM   AROM Assessment Site  Lumbar    Lumbar Flexion  50    Lumbar Extension  12    Lumbar - Right Side Bend  20    Lumbar - Left Side Bend  10    Lumbar - Right Rotation  8    Lumbar - Left Rotation  8      Strength   Strength Assessment Site  Hip;Knee;Ankle    Right Hip Flexion  4-/5    Right Hip Extension  4-/5    Right Hip ABduction  4/5    Left Hip Flexion  4/5    Left Hip Extension  4/5    Left Hip ABduction  4+/5    Right/Left Knee  Right;Left    Right Knee Flexion  4/5    Right Knee Extension  4+/5    Left Knee Flexion  4/5    Left Knee Extension  4+/5    Right Ankle Dorsiflexion  5/5    Right Ankle Plantar Flexion  --    Left Ankle Dorsiflexion  5/5      Palpation   Spinal mobility  Pain provocation with PA's to L3-5, slight hypomobility compared to L1-2    Palpation comment  tenderness along Lt lumbar paraspinal muscles      Transfers   Five time sit to stand comments   13.5" without UE support      Ambulation/Gait   Ambulation/Gait  Yes    Ambulation/Gait Assistance  7: Independent  Ambulation Distance (Feet)  408 Feet    Gait Pattern  Within Functional Limits    Ambulation Surface  Level;Indoor    Gait velocity  1.0 m/s        Objective measurements completed on examination: See above findings.    [facility] Adult PT Treatment/Exercise - 07/06/18 0001      Exercises    Exercises  Lumbar      Lumbar Exercises: Stretches   Prone on Elbows Stretch  10 seconds;Limitations    Prone on Elbows Stretch Limitations  10 reps       PT Education - 07/06/18 1000    Education Details  Educated on exam findigns and on appropriate plan for therapy.     Person(s) Educated  Patient    Methods  Explanation    Comprehension  Verbalized understanding       PT Short Term Goals - 07/06/18 1027      PT SHORT TERM GOAL #1   Title  Patient will be independent with HEP, updated PRN, to improve lumabr mobility and reduce stiffness, as well as increased bil LE muscle strength and postural endurance to improve activity tolerance.     Time  2    Period  Weeks    Status  New    Target Date  07/20/18      PT SHORT TERM GOAL #2   Title  Pateint will improve LE strength by 1/2 grade or more to demonstrate significant improvement in functional LE strength to improve endurance with daily activities.     Time  3    Period  Weeks    Status  New    Target Date  07/27/18        PT Long Term Goals - 07/06/18 1029      PT LONG TERM GOAL #1   Title  Pateint will improve LE strength by 1 grade or more to demonstrate significant improvement in functional LE strength to improve endurance with daily activities.     Time  6    Period  Weeks    Status  New    Target Date  08/17/18      PT LONG TERM GOAL #2   Title  Patient will improve ROM for lumbar spine by 8 degrees or more up to WNL's to demonstrate     Time  6    Period  Weeks    Status  New      PT LONG TERM GOAL #3   Title  Patient will report no pain with prolonged walking or standing to be bale to cook a full meal, wash dishes, grocery shop, perform laundry, and accompplish outher household activities.     Time  6    Period  Weeks    Status  New       Plan - 07/06/18 1016    Clinical Impression Statement  Ms. Demetro presents for physical therapy evaluation for chronic low back pain that gradually increased over  the last year. She reports extension postures for prolonged periods aggravate her lower back such as walking or standing. She does have some relief from laying down and topical gel for pain relief. Ms. jewel presents with bil LE weaknesses, greatest at hip muscle groups, hypomobility of lumbar spine, pain provocation with lumbar extension and Lt side bending, and decreased activity tolerance with walking/standing. She will benefit from skilled PT interventions to address impairments and improve QOL by reduceing pain with functional activities.  Clinical Presentation  Stable    Clinical Presentation due to:  FOTO, MMT, ROM, , clinical judgement    Clinical Decision Making  Low    Rehab Potential  Good    PT Frequency  1x / week    PT Duration  6 weeks    PT Treatment/Interventions  ADLs/Self Care Home Management;Aquatic Therapy;Cryotherapy;Electrical Stimulation;Moist Heat;Traction;DME Instruction;Functional mobility training;Therapeutic activities;Therapeutic exercise;Balance training;Neuromuscular re-education;Patient/family education;Manual techniques;Passive range of motion;Taping    PT Next Visit Plan  Review eval and goals. Perform PA's if with PT for pain relief to L3-5 central spinous process. Progres prone stretches and initiate hip strengthening to address weaknesses.     PT Home Exercise Plan  Eval: prone on elbows;     Consulted and Agree with Plan of Care  Patient       Patient will benefit from skilled therapeutic intervention in order to improve the following deficits and impairments:  Decreased activity tolerance, Decreased endurance, Decreased range of motion, Decreased strength, Decreased mobility, Pain, Hypomobility  Visit Diagnosis: Chronic midline low back pain without sciatica  Muscle weakness (generalized)     Problem List Patient Active Problem List   Diagnosis Date Noted  . UTI (lower urinary tract infection) 09/07/2013  . Encephalopathy acute 09/07/2013   . Influenza 09/07/2013  . HTN (hypertension) 09/07/2013  . Anxiety 09/07/2013  . Family hx of colon cancer 04/15/2011    Valentino Saxon, PT, DPT Physical Therapist with [facility]  07/06/2018 12:11 PM     [facility] [address] [place], [place], [address] Phone: [phone]   Fax:  [phone]  Name: DONNESHA KARG MRN: [id number] Date of Birth: [birth year]

## 2018-07-12 ENCOUNTER — Ambulatory Visit (HOSPITAL_COMMUNITY): Payer: Medicare HMO | Admitting: Physical Therapy

## 2018-07-12 DIAGNOSIS — G8929 Other chronic pain: Secondary | ICD-10-CM | POA: Diagnosis not present

## 2018-07-12 DIAGNOSIS — M545 Low back pain: Secondary | ICD-10-CM | POA: Diagnosis not present

## 2018-07-12 DIAGNOSIS — M6281 Muscle weakness (generalized): Secondary | ICD-10-CM | POA: Diagnosis not present

## 2018-07-12 NOTE — Therapy (Signed)
[facility] [address] [place], [place], [address] Phone: [phone]   Fax:  [phone]  Physical Therapy Treatment  Patient Details  Name: Claire Barnett MRN: [id number] Date of Birth: [birth year] Referring Provider (PT): Odette FractionHarkins, Paul, MD   Encounter Date: 07/12/2018  PT End of Session - 07/12/18 1107    Visit Number  2    Number of Visits  7    Date for PT Re-Evaluation  08/17/18    Authorization Type  [employer] Medicare HMO (no auth required, no visit limit, $40 co-pay)    Authorization Time Period  07/06/18 - 08/18/18    Authorization - Visit Number  2    Authorization - Number of Visits  7    PT Start Time  1038    PT Stop Time  1116    PT Time Calculation (min)  38 min    Activity Tolerance  Patient tolerated treatment well    Behavior During Therapy  [facility]       Past Medical History:  Diagnosis Date  . Anxiety   . History of kidney stones   . Hypertension   . S/P colonoscopy August 2007   Rare sigmoid diverticulosis.  Otherwise, no polyps, inflammatory    Past Surgical History:  Procedure Laterality Date  . ABDOMINAL HYSTERECTOMY    . BREAST LUMPECTOMY Right    benign  . CATARACT EXTRACTION W/PHACO Right 02/01/2013   Procedure: CATARACT EXTRACTION PHACO AND INTRAOCULAR LENS PLACEMENT (IOC);  Surgeon: Gemma PayorKerry Hunt, MD;  Location: [facility];  Service: Ophthalmology;  Laterality: Right;  CDE:25.80  . COLONOSCOPY  05/12/2011   Procedure: COLONOSCOPY;  Surgeon: Arlyce HarmanSandi M Fields, MD;  Location: [facility];  Service: Endoscopy;  Laterality: N/A;  10:30AM  . CYSTOSCOPY W/ STONE MANIPULATION     x3  . kidney stones     X 4 procedures    There were no vitals filed for this visit.  Subjective Assessment - 07/12/18 1043    Subjective  PT states her pain is 5/10 currently without sciatica.  Pain across central lower back.  Reports complaince with HEP.    Currently in Pain?  Yes    Pain Score  5     Pain  Location  Back    Pain Orientation  Lower    Pain Descriptors / Indicators  Constant;Sore    Pain Type  Chronic pain                       [facility] Adult PT Treatment/Exercise - 07/12/18 0001      Exercises   Exercises  Lumbar      Lumbar Exercises: Stretches   Prone on Elbows Stretch  10 seconds;Limitations    Prone on Elbows Stretch Limitations  10 reps      Lumbar Exercises: Supine   Ab Set  10 reps;5 seconds    Clam  10 reps    Bridge  10 reps    Straight Leg Raise  10 reps      Lumbar Exercises: Sidelying   Hip Abduction  Both;10 reps      Lumbar Exercises: Prone   Other Prone Lumbar Exercises  heelsqueeze 10X5"             PT Education - 07/12/18 1107    Education Details  reveiwed goals and HEP    Person(s) Educated  Patient    Methods  Explanation  Comprehension  Verbalized understanding       PT Short Term Goals - 07/12/18 1112      PT SHORT TERM GOAL #1   Title  Patient will be independent with HEP, updated PRN, to improve lumabr mobility and reduce stiffness, as well as increased bil LE muscle strength and postural endurance to improve activity tolerance.     Time  2    Period  Weeks    Status  On-going      PT SHORT TERM GOAL #2   Title  Pateint will improve LE strength by 1/2 grade or more to demonstrate significant improvement in functional LE strength to improve endurance with daily activities.     Time  3    Period  Weeks    Status  On-going        PT Long Term Goals - 07/12/18 1113      PT LONG TERM GOAL #1   Title  Pateint will improve LE strength by 1 grade or more to demonstrate significant improvement in functional LE strength to improve endurance with daily activities.     Time  6    Period  Weeks    Status  On-going      PT LONG TERM GOAL #2   Title  Patient will improve ROM for lumbar spine by 8 degrees or more up to WNL's to demonstrate     Time  6    Period  Weeks    Status  On-going      PT LONG TERM  GOAL #3   Title  Patient will report no pain with prolonged walking or standing to be bale to cook a full meal, wash dishes, grocery shop, perform laundry, and accompplish outher household activities.     Time  6    Period  Weeks    Status  On-going            Plan - 07/12/18 1108    Clinical Impression Statement  reveiwed goals and HEP issued at evaluation.  Pt with residual soreness from prone exercises, however no issues or pain completing.  Progressed with lumbar stab and LE strengthening exercises this session.  PT requires cues to complete in correct form and stabilize core while completing.  No new exercises issued this session as needs further instructions.    Rehab Potential  Good    PT Frequency  1x / week    PT Duration  6 weeks    PT Treatment/Interventions  ADLs/Self Care Home Management;Aquatic Therapy;Cryotherapy;Electrical Stimulation;Moist Heat;Traction;DME Instruction;Functional mobility training;Therapeutic activities;Therapeutic exercise;Balance training;Neuromuscular re-education;Patient/family education;Manual techniques;Passive range of motion;Taping    PT Next Visit Plan  Perform PA's if with PT for pain relief to L3-5 central spinous process. Progress strengthening and stretching.  Issue new exericses for HEP when independent/improved form.     PT Home Exercise Plan  Eval: prone on elbows; press ups    Consulted and Agree with Plan of Care  Patient       Patient will benefit from skilled therapeutic intervention in order to improve the following deficits and impairments:  Decreased activity tolerance, Decreased endurance, Decreased range of motion, Decreased strength, Decreased mobility, Pain, Hypomobility  Visit Diagnosis: Chronic midline low back pain without sciatica  Muscle weakness (generalized)     Problem List Patient Active Problem List   Diagnosis Date Noted  . UTI (lower urinary tract infection) 09/07/2013  . Encephalopathy acute 09/07/2013   . Influenza 09/07/2013  . HTN (hypertension) 09/07/2013  .  Anxiety 09/07/2013  . Family hx of colon cancer 04/15/2011   Claire Barnett, PTA/CLT [id number]  Claire Barnett 07/12/2018, 11:14 AM  [place] [facility] [address] [place], [place], [address] Phone: [phone]   Fax:  [phone]  Name: Claire Barnett MRN: [id number] Date of Birth: 1935/09/25

## 2018-07-19 ENCOUNTER — Other Ambulatory Visit: Payer: Self-pay

## 2018-07-19 ENCOUNTER — Encounter (HOSPITAL_COMMUNITY): Payer: Self-pay

## 2018-07-19 ENCOUNTER — Ambulatory Visit (HOSPITAL_COMMUNITY): Payer: Medicare HMO

## 2018-07-19 DIAGNOSIS — M545 Low back pain: Principal | ICD-10-CM

## 2018-07-19 DIAGNOSIS — M6281 Muscle weakness (generalized): Secondary | ICD-10-CM | POA: Diagnosis not present

## 2018-07-19 DIAGNOSIS — G8929 Other chronic pain: Secondary | ICD-10-CM | POA: Diagnosis not present

## 2018-07-19 NOTE — Patient Instructions (Signed)
Supine Bridge reps: 10 sets: 2 daily: 1 weekly: 7   Exercise image step 1   Exercise image step 2 Setup  Begin lying on your back with your arms resting at your sides, your legs bent at the knees and your feet flat on the ground. Movement  Tighten your abdominals and slowly lift your hips off the floor into a bridge position, keeping your back straight. Tip  Make sure to keep your trunk stiff throughout the exercise and your arms flat on the floor. Disclaimer: This program provides exercises related to your condition that you can perform at home. As there is a risk of injury with any activity, use caution when performing exercises. If you experience any pain or discomfort, discontinue the exercises and contact your health care provider.  Login URL: [email or web address] . Access Code: [email or web address] . Date printed: 07/19/2018 Page 1  Clamshell reps: 10 sets: 2 daily: 1 weekly: 7   Exercise image step 1   Exercise image step 2 Setup  Begin lying on your side with your knees bent and your hips and shoulders stacked. Movement  Engage your abdominals and raise your top knee up toward the ceiling, then slowly return to the starting position and repeat.  Tip  Make sure to keep your core engaged and do not roll your hips forward or backward during the exercise. Supine Active Straight Leg Raise reps: 10 sets: 2 daily: 1 weekly: 7   Exercise image step 1   Exercise image step 2  Disclaimer: This program provides exercises related to your condition that you can perform at home. As there is a risk of injury with any activity, use caution when performing exercises. If you experience any pain or discomfort, discontinue the exercises and contact your health care provider.  Login URL: [email or web address] . Access Code: [email or web address] . Date printed: 07/19/2018 Page 2  Setup  Begin lying on your back with one knee bent and your other leg straight. Movement  Engaging your thigh muscles, slowly lift  your straight leg until it is parallel with your other thigh, then lower it back to the starting position and repeat. Tip  Make sure to keep your leg straight and do not let your back arch during the exercise.

## 2018-07-19 NOTE — Therapy (Signed)
[facility] [facility] [address] [place], [place], [address] Phone: [phone]   Fax:  [phone]  Physical Therapy Treatment  Patient Details  Name: Claire Barnett MRN: [id number] Date of Birth: [birth year] Referring Provider (PT): Odette Fraction, MD   Encounter Date: 07/19/2018  PT End of Session - 07/19/18 1103    Visit Number  3    Number of Visits  7    Date for PT Re-Evaluation  08/17/18    Authorization Type  [employer] Medicare HMO (no auth required, no visit limit, $40 co-pay)    Authorization Time Period  07/06/18 - 08/18/18    Authorization - Visit Number  3    Authorization - Number of Visits  7    PT Start Time  1038    PT Stop Time  1116    PT Time Calculation (min)  38 min    Activity Tolerance  Patient tolerated treatment well    Behavior During Therapy  [facility] for tasks assessed/performed       Past Medical History:  Diagnosis Date  . Anxiety   . History of kidney stones   . Hypertension   . S/P colonoscopy August 2007   Rare sigmoid diverticulosis.  Otherwise, no polyps, inflammatory    Past Surgical History:  Procedure Laterality Date  . ABDOMINAL HYSTERECTOMY    . BREAST LUMPECTOMY Right    benign  . CATARACT EXTRACTION W/PHACO Right 02/01/2013   Procedure: CATARACT EXTRACTION PHACO AND INTRAOCULAR LENS PLACEMENT (IOC);  Surgeon: Gemma Payor, MD;  Location: [facility];  Service: Ophthalmology;  Laterality: Right;  CDE:25.80  . COLONOSCOPY  05/12/2011   Procedure: COLONOSCOPY;  Surgeon: Arlyce Harman, MD;  Location: [facility];  Service: Endoscopy;  Laterality: N/A;  10:30AM  . CYSTOSCOPY W/ STONE MANIPULATION     x3  . kidney stones     X 4 procedures    There were no vitals filed for this visit.  Subjective Assessment - 07/19/18 1042    Subjective  Patient reports she is not having pain currently and is having a good morning so far. She reports her HEP is going well and she is not having difficulty with the exercises.      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  No/denies        [facility] Adult PT Treatment/Exercise - 07/19/18 0001      Exercises   Exercises  Lumbar      Lumbar Exercises: Stretches   Passive Hamstring Stretch  Right;Left;2 reps;30 seconds;Limitations    Passive Hamstring Stretch Limitations  supinne with rope    Lower Trunk Rotation  Limitations    Lower Trunk Rotation Limitations  10 x 5 sec bil     Prone on Elbows Stretch  10 seconds;Limitations    Prone on Elbows Stretch Limitations  10 reps      Lumbar Exercises: Standing   Other Standing Lumbar Exercises  Wall arch: 1x 10 reps, 3 sec holds      Lumbar Exercises: Supine   Ab Set  15 reps;5 seconds    Bridge  10 reps;3 seconds;Limitations    Bridge Limitations  2 sets    Straight Leg Raise  10 reps    Straight Leg Raises Limitations  2sets, bil LE  Lumbar Exercises: Sidelying   Clam  Both;10 reps;3 seconds        PT Education - 07/19/18 1213    Education Details  Educated on exercise throughout and updated HEP.     Person(s) Educated  Patient    Methods  Explanation;Demonstration;Handout    Comprehension  Verbalized understanding;Returned demonstration       PT Short Term Goals - 07/12/18 1112      PT SHORT TERM GOAL #1   Title  Patient will be independent with HEP, updated PRN, to improve lumabr mobility and reduce stiffness, as well as increased bil LE muscle strength and postural endurance to improve activity tolerance.     Time  2    Period  Weeks    Status  On-going      PT SHORT TERM GOAL #2   Title  Pateint will improve LE strength by 1/2 grade or more to demonstrate significant improvement in functional LE strength to improve endurance with daily activities.     Time  3    Period  Weeks    Status  On-going        PT Long Term  Goals - 07/12/18 1113      PT LONG TERM GOAL #1   Title  Pateint will improve LE strength by 1 grade or more to demonstrate significant improvement in functional LE strength to improve endurance with daily activities.     Time  6    Period  Weeks    Status  On-going      PT LONG TERM GOAL #2   Title  Patient will improve ROM for lumbar spine by 8 degrees or more up to WNL's to demonstrate     Time  6    Period  Weeks    Status  On-going      PT LONG TERM GOAL #3   Title  Patient will report no pain with prolonged walking or standing to be bale to cook a full meal, wash dishes, grocery shop, perform laundry, and accompplish outher household activities.     Time  6    Period  Weeks    Status  On-going         Plan - 07/19/18 1104    Clinical Impression Statement  Continued with current POC to focus on LE strengthening and lumbar mobility into extension for pain relief. Patient was able to progress repetition without an onset of pain or fatigue. She requires verbal/tactile cues to perform exercise with correct form initially but improves with repetition. Progressed to standing postural strengthening with wall arch and required min guarding for safety. She will continue to benefit from skilled PT interventions to address impairments and improve mobility to improve QOL.    Rehab Potential  Good    PT Frequency  1x / week    PT Duration  6 weeks    PT Treatment/Interventions  ADLs/Self Care Home Management;Aquatic Therapy;Cryotherapy;Electrical Stimulation;Moist Heat;Traction;DME Instruction;Functional mobility training;Therapeutic activities;Therapeutic exercise;Balance training;Neuromuscular re-education;Patient/family education;Manual techniques;Passive range of motion;Taping    PT Next Visit Plan  Perform PA's if with PT for pain relief to L3-5 central spinous process. Progress strengthening and stretching. Follow up on HEP.    PT Home Exercise Plan  Eval: prone on elbows; press ups;  bridge, clamshell, SLR    Consulted and Agree with Plan of Care  Patient       Patient will benefit from skilled therapeutic intervention in order to improve the following deficits and impairments:  Decreased activity tolerance, Decreased endurance, Decreased range of motion, Decreased strength, Decreased mobility, Pain, Hypomobility  Visit Diagnosis: Chronic midline low back pain without sciatica  Muscle weakness (generalized)     Problem List Patient Active Problem List   Diagnosis Date Noted  . UTI (lower urinary tract infection) 09/07/2013  . Encephalopathy acute 09/07/2013  . Influenza 09/07/2013  . HTN (hypertension) 09/07/2013  . Anxiety 09/07/2013  . Family hx of colon cancer 04/15/2011    Valentino Saxonachel Quinn-Brown, PT, DPT Physical Therapist with [facility]  07/19/2018 12:13 PM    [facility] [address] [place], [place], [address] Phone: [phone]   Fax:  [phone]  Name: Arlean Hoppingdna M Pilar MRN: [id number] Date of Birth: 12/03/1935

## 2018-07-21 ENCOUNTER — Telehealth (HOSPITAL_COMMUNITY): Payer: Self-pay | Admitting: Internal Medicine

## 2018-07-21 ENCOUNTER — Telehealth (HOSPITAL_COMMUNITY): Payer: Self-pay | Admitting: Physical Therapy

## 2018-07-21 NOTE — Telephone Encounter (Signed)
l/m Ask pt if she canf come in at 9:45 Cjc on this date- need to get lympy pt in @ 3:15PM if possible. NF 07/21/18

## 2018-07-21 NOTE — Telephone Encounter (Signed)
07/21/18  Pt called back and she was ok with switching her time on 11/27

## 2018-07-26 ENCOUNTER — Ambulatory Visit (HOSPITAL_COMMUNITY): Payer: Medicare HMO

## 2018-07-26 ENCOUNTER — Encounter (HOSPITAL_COMMUNITY): Payer: Self-pay

## 2018-07-26 DIAGNOSIS — M6281 Muscle weakness (generalized): Secondary | ICD-10-CM | POA: Diagnosis not present

## 2018-07-26 DIAGNOSIS — M545 Low back pain, unspecified: Secondary | ICD-10-CM

## 2018-07-26 DIAGNOSIS — G8929 Other chronic pain: Secondary | ICD-10-CM | POA: Diagnosis not present

## 2018-07-26 NOTE — Therapy (Addendum)
[place] [facility] [address] [place], [place], [address] Phone: [phone]   Fax:  [phone]  Physical Therapy Treatment  Patient Details  Name: Claire Barnett MRN: [id number] Date of Birth: 08-30-1936 Referring Provider (PT): Odette FractionHarkins, Paul, MD   Encounter Date: 07/26/2018  PT End of Session - 07/26/18 1136    Visit Number  4    Number of Visits  7    Date for PT Re-Evaluation  08/17/18    Authorization Type  [employer] Medicare HMO (no auth required, no visit limit, $40 co-pay)    Authorization Time Period  07/06/18 - 08/18/18    Authorization - Visit Number  4    Authorization - Number of Visits  7    PT Start Time  1120    PT Stop Time  1202    PT Time Calculation (min)  42 min    Activity Tolerance  Patient tolerated treatment well    Behavior During Therapy  [facility] for tasks assessed/performed       Past Medical History:  Diagnosis Date  . Anxiety   . History of kidney stones   . Hypertension   . S/P colonoscopy August 2007   Rare sigmoid diverticulosis.  Otherwise, no polyps, inflammatory    Past Surgical History:  Procedure Laterality Date  . ABDOMINAL HYSTERECTOMY    . BREAST LUMPECTOMY Right    benign  . CATARACT EXTRACTION W/PHACO Right 02/01/2013   Procedure: CATARACT EXTRACTION PHACO AND INTRAOCULAR LENS PLACEMENT (IOC);  Surgeon: Gemma PayorKerry Hunt, MD;  Location: [facility];  Service: Ophthalmology;  Laterality: Right;  CDE:25.80  . COLONOSCOPY  05/12/2011   Procedure: COLONOSCOPY;  Surgeon: Arlyce HarmanSandi M Fields, MD;  Location: [facility];  Service: Endoscopy;  Laterality: N/A;  10:30AM  . CYSTOSCOPY W/ STONE MANIPULATION     x3  . kidney stones     X 4 procedures    There were no vitals filed for this visit.  Subjective Assessment - 07/26/18 1127    Subjective  Pt reports decreased in frequency as well as intensity when it does occur.  No reports of pain currently.  Reports compliance iwht HEP 2x/daily.    How long can you sit  comfortably?  11/27: Able to sit comfortably for several hours    How long can you stand comfortably?  11/27:Able to stand for 2 hours    How long can you walk comfortably?  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  No/denies         [facility] PT Assessment - 07/26/18 0001      Assessment   Medical Diagnosis  Low Back Pain    Referring Provider (PT)  Odette FractionHarkins, Paul, MD    Onset Date/Surgical Date  --   approximately 1 year ago   Hand Dominance  Right    Prior Therapy  none      Strength   Right Hip Flexion  4+/5   WAS 4-/5   Right Hip Extension  4-/5   was 4-/5   Right Hip ABduction  4/5   was 4/5   Left Hip Flexion  4+/5   was 4/5   Left Hip Extension  4/5   was 4/5   Left Hip ABduction  4+/5   was 4+/5  Right/Left Knee  Right;Left    Right Knee Flexion  4/5   was 4/5   Right Knee Extension  --   was 4+/5   Left Knee Flexion  4+/5   was 4/5   Left Knee Extension  --   was 4+/5   Right Ankle Dorsiflexion  5/5    Left Ankle Dorsiflexion  5/5                   [facility] Adult PT Treatment/Exercise - 07/26/18 0001      Exercises   Exercises  Lumbar      Lumbar Exercises: Stretches   Passive Hamstring Stretch  Right;Left;2 reps;30 seconds;Limitations    Passive Hamstring Stretch Limitations  supine with rope    Lower Trunk Rotation  5 reps;10 seconds    Lower Trunk Rotation Limitations  5x 10" Bil    Press Ups  10 reps;10 seconds      Lumbar Exercises: Standing   Row  10 reps;Theraband    Theraband Level (Row)  Level 2 (Red)    Shoulder Extension  10 reps;Theraband    Theraband Level (Shoulder Extension)  Level 2 (Red)    Other Standing Lumbar Exercises  Wall arch: 1x 10 reps, 3 sec holds      Lumbar Exercises: Supine   Bridge  10 reps;3 seconds;Limitations    Bridge Limitations  2 sets      Lumbar  Exercises: Sidelying   Clam  Both;10 reps;3 seconds    Clam Limitations  with RTB               PT Short Term Goals - 07/26/18 1213      PT SHORT TERM GOAL #1   Title  Patient will be independent with HEP, updated PRN, to improve lumabr mobility and reduce stiffness, as well as increased bil LE muscle strength and postural endurance to improve activity tolerance.     Baseline  11/27:  Reports complaince with HEP daily    Status  Achieved      PT SHORT TERM GOAL #2   Title  Pateint will improve LE strength by 1/2 grade or more to demonstrate significant improvement in functional LE strength to improve endurance with daily activities.     Baseline  11/27:  see MMT    Status  On-going        PT Long Term Goals - 07/26/18 1214      PT LONG TERM GOAL #1   Title  Pateint will improve LE strength by 1 grade or more to demonstrate significant improvement in functional LE strength to improve endurance with daily activities.     Status  On-going      PT LONG TERM GOAL #2   Title  Patient will improve ROM for lumbar spine by 8 degrees or more up to WNL's to demonstrate     Baseline  not assessed 07/26/2018      PT LONG TERM GOAL #3   Title  Patient will report no pain with prolonged walking or standing to be bale to cook a full meal, wash dishes, grocery shop, perform laundry, and accompplish outher household activities.     Baseline  11/27:  Reports tolerance to stand and walk for 2 hours comfortably    Status  On-going            Plan - 07/26/18 1148    Clinical Impression Statement  Continued with current POC focusing on LE strenghtening, lumbar mobility  and additional postural strengthening for pain relief.  Pt reports MD apt on 12/03, reviewed STG.  Pt reports complaince with HEP and able to verbalize/demonstrate appropriate mechanics with all exercises.  Pt also reports improved tolerance with sitting, standing and walking for 2-3 hours comfortably.  Pt MMT complete  with small gains with hip strengthening.  Added resistance with clams as well as began standing postural strengthening exercises with cueing for technique.  No reports of pain through session.    Rehab Potential  Good    PT Frequency  1x / week    PT Duration  6 weeks    PT Treatment/Interventions  ADLs/Self Care Home Management;Aquatic Therapy;Cryotherapy;Electrical Stimulation;Moist Heat;Traction;DME Instruction;Functional mobility training;Therapeutic activities;Therapeutic exercise;Balance training;Neuromuscular re-education;Patient/family education;Manual techniques;Passive range of motion;Taping    PT Next Visit Plan  Perform PA's if with PT for pain relief to L3-5 central spinous process. Progress strengthening and stretching. Follow up on HEP.    PT Home Exercise Plan  Eval: prone on elbows; press ups; bridge, clamshell, SLR       Patient will benefit from skilled therapeutic intervention in order to improve the following deficits and impairments:  Decreased activity tolerance, Decreased endurance, Decreased range of motion, Decreased strength, Decreased mobility, Pain, Hypomobility  Visit Diagnosis: Chronic midline low back pain without sciatica  Muscle weakness (generalized)     Problem List Patient Active Problem List   Diagnosis Date Noted  . UTI (lower urinary tract infection) 09/07/2013  . Encephalopathy acute 09/07/2013  . Influenza 09/07/2013  . HTN (hypertension) 09/07/2013  . Anxiety 09/07/2013  . Family hx of colon cancer 04/15/2011   Becky Sax, LPTA; CBIS [phone] Juel Burrow 07/26/2018, 12:15 PM  [place] [facility] [address] [place], [place], [address] Phone: [phone]   Fax:  [phone]  Name: ARIAHNA SMIDDY MRN: [id number] Date of Birth: Mar 02, 1936

## 2018-08-01 ENCOUNTER — Telehealth (HOSPITAL_COMMUNITY): Payer: Self-pay

## 2018-08-01 DIAGNOSIS — Z6826 Body mass index (BMI) 26.0-26.9, adult: Secondary | ICD-10-CM | POA: Diagnosis not present

## 2018-08-01 DIAGNOSIS — M545 Low back pain: Secondary | ICD-10-CM | POA: Diagnosis not present

## 2018-08-01 DIAGNOSIS — G8929 Other chronic pain: Secondary | ICD-10-CM | POA: Diagnosis not present

## 2018-08-01 DIAGNOSIS — I10 Essential (primary) hypertension: Secondary | ICD-10-CM | POA: Diagnosis not present

## 2018-08-01 DIAGNOSIS — M47816 Spondylosis without myelopathy or radiculopathy, lumbar region: Secondary | ICD-10-CM | POA: Diagnosis not present

## 2018-08-01 NOTE — Telephone Encounter (Signed)
Pt came by requested to be D/c she seen her MD and wants to complete her exercises at home due to finanical hardship. NF

## 2018-08-02 ENCOUNTER — Ambulatory Visit (HOSPITAL_COMMUNITY): Payer: Medicare HMO

## 2018-08-02 ENCOUNTER — Encounter (HOSPITAL_COMMUNITY): Payer: Self-pay

## 2018-08-02 NOTE — Therapy (Signed)
[place], [place], [address] Phone: [phone]   Fax:  [phone]  Patient Details  Name: Claire Barnett MRN: [id number] Date of Birth: Dec 20, 1935 Referring Provider:  No ref. provider found  Encounter Date: 08/02/2018   PHYSICAL THERAPY DISCHARGE SUMMARY  Visits from Start of Care: 4  Current functional level related to goals / functional outcomes: Patient called on 08/01/18 requesting to be discharged from therapy due to financial limitations. She had discussed this with her doctor as well. She participated in an evaluation and 3 treatments and had made good progress with strength as seen by objective measures last visit on 07/26/18. She will be discharged from therapy per patient request.   Remaining deficits: See last treatment note.   Education / Equipment: Educated on [place]: prone on elbows; press ups; bridge, clamshell, SLR       Plan: Patient agrees to discharge.  Patient goals were partially met. Patient is being discharged due to meeting the stated rehab goals.  ?????     Kipp Brood, PT, DPT Physical Therapist with [facility]  08/02/2018 8:40 AM    [place], [place], [address] Phone: [phone]   Fax:  [phone]

## 2018-08-09 ENCOUNTER — Encounter (HOSPITAL_COMMUNITY): Payer: Medicare HMO

## 2018-08-16 ENCOUNTER — Encounter (HOSPITAL_COMMUNITY): Payer: Medicare HMO

## 2018-10-23 DIAGNOSIS — Z6825 Body mass index (BMI) 25.0-25.9, adult: Secondary | ICD-10-CM | POA: Diagnosis not present

## 2018-10-23 DIAGNOSIS — M47816 Spondylosis without myelopathy or radiculopathy, lumbar region: Secondary | ICD-10-CM | POA: Diagnosis not present

## 2018-10-23 DIAGNOSIS — I10 Essential (primary) hypertension: Secondary | ICD-10-CM | POA: Diagnosis not present

## 2019-01-02 DIAGNOSIS — Z6824 Body mass index (BMI) 24.0-24.9, adult: Secondary | ICD-10-CM | POA: Diagnosis not present

## 2019-01-02 DIAGNOSIS — I10 Essential (primary) hypertension: Secondary | ICD-10-CM | POA: Diagnosis not present

## 2019-01-02 DIAGNOSIS — F419 Anxiety disorder, unspecified: Secondary | ICD-10-CM | POA: Diagnosis not present

## 2019-01-02 DIAGNOSIS — Z1389 Encounter for screening for other disorder: Secondary | ICD-10-CM | POA: Diagnosis not present

## 2019-01-18 DIAGNOSIS — E039 Hypothyroidism, unspecified: Secondary | ICD-10-CM | POA: Diagnosis not present

## 2019-01-18 DIAGNOSIS — N183 Chronic kidney disease, stage 3 (moderate): Secondary | ICD-10-CM | POA: Diagnosis not present

## 2019-01-18 DIAGNOSIS — Z1389 Encounter for screening for other disorder: Secondary | ICD-10-CM | POA: Diagnosis not present

## 2019-01-18 DIAGNOSIS — E7849 Other hyperlipidemia: Secondary | ICD-10-CM | POA: Diagnosis not present

## 2019-01-18 DIAGNOSIS — Z681 Body mass index (BMI) 19 or less, adult: Secondary | ICD-10-CM | POA: Diagnosis not present

## 2019-01-18 DIAGNOSIS — Z0000 Encounter for general adult medical examination without abnormal findings: Secondary | ICD-10-CM | POA: Diagnosis not present

## 2019-01-24 DIAGNOSIS — R7309 Other abnormal glucose: Secondary | ICD-10-CM | POA: Diagnosis not present

## 2019-01-24 DIAGNOSIS — I10 Essential (primary) hypertension: Secondary | ICD-10-CM | POA: Diagnosis not present

## 2019-01-24 DIAGNOSIS — R7301 Impaired fasting glucose: Secondary | ICD-10-CM | POA: Diagnosis not present

## 2019-01-24 DIAGNOSIS — E7849 Other hyperlipidemia: Secondary | ICD-10-CM | POA: Diagnosis not present

## 2019-01-24 DIAGNOSIS — R946 Abnormal results of thyroid function studies: Secondary | ICD-10-CM | POA: Diagnosis not present

## 2019-04-05 DIAGNOSIS — M545 Low back pain: Secondary | ICD-10-CM | POA: Diagnosis not present

## 2019-04-05 DIAGNOSIS — M47816 Spondylosis without myelopathy or radiculopathy, lumbar region: Secondary | ICD-10-CM | POA: Diagnosis not present

## 2019-04-05 DIAGNOSIS — Z6823 Body mass index (BMI) 23.0-23.9, adult: Secondary | ICD-10-CM | POA: Diagnosis not present

## 2019-04-05 DIAGNOSIS — G64 Other disorders of peripheral nervous system: Secondary | ICD-10-CM | POA: Diagnosis not present

## 2019-04-05 DIAGNOSIS — G629 Polyneuropathy, unspecified: Secondary | ICD-10-CM | POA: Diagnosis not present

## 2019-04-05 DIAGNOSIS — I10 Essential (primary) hypertension: Secondary | ICD-10-CM | POA: Diagnosis not present

## 2019-04-23 DIAGNOSIS — M545 Low back pain: Secondary | ICD-10-CM | POA: Diagnosis not present

## 2019-04-24 DIAGNOSIS — E039 Hypothyroidism, unspecified: Secondary | ICD-10-CM | POA: Diagnosis not present

## 2019-04-24 DIAGNOSIS — I10 Essential (primary) hypertension: Secondary | ICD-10-CM | POA: Diagnosis not present

## 2019-04-24 DIAGNOSIS — E559 Vitamin D deficiency, unspecified: Secondary | ICD-10-CM | POA: Diagnosis not present

## 2019-04-24 DIAGNOSIS — G603 Idiopathic progressive neuropathy: Secondary | ICD-10-CM | POA: Diagnosis not present

## 2019-04-30 DIAGNOSIS — E782 Mixed hyperlipidemia: Secondary | ICD-10-CM | POA: Diagnosis not present

## 2019-04-30 DIAGNOSIS — E063 Autoimmune thyroiditis: Secondary | ICD-10-CM | POA: Diagnosis not present

## 2019-04-30 DIAGNOSIS — E876 Hypokalemia: Secondary | ICD-10-CM | POA: Diagnosis not present

## 2019-04-30 DIAGNOSIS — I10 Essential (primary) hypertension: Secondary | ICD-10-CM | POA: Diagnosis not present

## 2019-05-29 ENCOUNTER — Ambulatory Visit (HOSPITAL_COMMUNITY)
Admission: RE | Admit: 2019-05-29 | Discharge: 2019-05-29 | Disposition: A | Discharge status: Home / Self Care | Payer: Medicare HMO | Source: Ambulatory Visit | Attending: Internal Medicine | Admitting: Internal Medicine

## 2019-05-29 ENCOUNTER — Other Ambulatory Visit: Payer: Self-pay

## 2019-05-29 ENCOUNTER — Other Ambulatory Visit (HOSPITAL_COMMUNITY): Payer: Self-pay | Admitting: Internal Medicine

## 2019-05-29 DIAGNOSIS — M545 Low back pain, unspecified: Secondary | ICD-10-CM

## 2019-05-30 DIAGNOSIS — I10 Essential (primary) hypertension: Secondary | ICD-10-CM | POA: Diagnosis not present

## 2019-05-30 DIAGNOSIS — N189 Chronic kidney disease, unspecified: Secondary | ICD-10-CM | POA: Diagnosis not present

## 2019-06-08 ENCOUNTER — Other Ambulatory Visit (HOSPITAL_COMMUNITY): Payer: Self-pay | Admitting: Internal Medicine

## 2019-06-08 DIAGNOSIS — Z1231 Encounter for screening mammogram for malignant neoplasm of breast: Secondary | ICD-10-CM

## 2019-06-11 DIAGNOSIS — Z79899 Other long term (current) drug therapy: Secondary | ICD-10-CM | POA: Diagnosis not present

## 2019-06-22 ENCOUNTER — Other Ambulatory Visit: Payer: Self-pay

## 2019-06-22 ENCOUNTER — Ambulatory Visit (HOSPITAL_COMMUNITY)
Admission: RE | Admit: 2019-06-22 | Discharge: 2019-06-22 | Disposition: A | Discharge status: Home / Self Care | Payer: Medicare HMO | Source: Ambulatory Visit | Attending: Internal Medicine | Admitting: Internal Medicine

## 2019-06-22 DIAGNOSIS — Z1231 Encounter for screening mammogram for malignant neoplasm of breast: Secondary | ICD-10-CM

## 2019-08-30 DIAGNOSIS — N183 Chronic kidney disease, stage 3 unspecified: Secondary | ICD-10-CM | POA: Diagnosis not present

## 2019-08-30 DIAGNOSIS — I129 Hypertensive chronic kidney disease with stage 1 through stage 4 chronic kidney disease, or unspecified chronic kidney disease: Secondary | ICD-10-CM | POA: Diagnosis not present

## 2019-09-30 DIAGNOSIS — I129 Hypertensive chronic kidney disease with stage 1 through stage 4 chronic kidney disease, or unspecified chronic kidney disease: Secondary | ICD-10-CM | POA: Diagnosis not present

## 2019-09-30 DIAGNOSIS — N183 Chronic kidney disease, stage 3 unspecified: Secondary | ICD-10-CM | POA: Diagnosis not present

## 2019-10-23 DIAGNOSIS — I10 Essential (primary) hypertension: Secondary | ICD-10-CM | POA: Diagnosis not present

## 2019-10-23 DIAGNOSIS — G603 Idiopathic progressive neuropathy: Secondary | ICD-10-CM | POA: Diagnosis not present

## 2019-10-23 DIAGNOSIS — E559 Vitamin D deficiency, unspecified: Secondary | ICD-10-CM | POA: Diagnosis not present

## 2019-10-23 DIAGNOSIS — E039 Hypothyroidism, unspecified: Secondary | ICD-10-CM | POA: Diagnosis not present

## 2019-10-28 DIAGNOSIS — N183 Chronic kidney disease, stage 3 unspecified: Secondary | ICD-10-CM | POA: Diagnosis not present

## 2019-10-28 DIAGNOSIS — I129 Hypertensive chronic kidney disease with stage 1 through stage 4 chronic kidney disease, or unspecified chronic kidney disease: Secondary | ICD-10-CM | POA: Diagnosis not present

## 2019-11-28 DIAGNOSIS — I129 Hypertensive chronic kidney disease with stage 1 through stage 4 chronic kidney disease, or unspecified chronic kidney disease: Secondary | ICD-10-CM | POA: Diagnosis not present

## 2019-11-28 DIAGNOSIS — N183 Chronic kidney disease, stage 3 unspecified: Secondary | ICD-10-CM | POA: Diagnosis not present

## 2019-12-28 DIAGNOSIS — N183 Chronic kidney disease, stage 3 unspecified: Secondary | ICD-10-CM | POA: Diagnosis not present

## 2019-12-28 DIAGNOSIS — I129 Hypertensive chronic kidney disease with stage 1 through stage 4 chronic kidney disease, or unspecified chronic kidney disease: Secondary | ICD-10-CM | POA: Diagnosis not present

## 2020-01-10 DIAGNOSIS — E7849 Other hyperlipidemia: Secondary | ICD-10-CM | POA: Diagnosis not present

## 2020-01-10 DIAGNOSIS — Z6826 Body mass index (BMI) 26.0-26.9, adult: Secondary | ICD-10-CM | POA: Diagnosis not present

## 2020-01-10 DIAGNOSIS — R7301 Impaired fasting glucose: Secondary | ICD-10-CM | POA: Diagnosis not present

## 2020-01-10 DIAGNOSIS — I10 Essential (primary) hypertension: Secondary | ICD-10-CM | POA: Diagnosis not present

## 2020-01-10 DIAGNOSIS — Z1389 Encounter for screening for other disorder: Secondary | ICD-10-CM | POA: Diagnosis not present

## 2020-01-10 DIAGNOSIS — E063 Autoimmune thyroiditis: Secondary | ICD-10-CM | POA: Diagnosis not present

## 2020-01-10 DIAGNOSIS — R946 Abnormal results of thyroid function studies: Secondary | ICD-10-CM | POA: Diagnosis not present

## 2020-01-10 DIAGNOSIS — M3500 Sicca syndrome, unspecified: Secondary | ICD-10-CM | POA: Diagnosis not present

## 2020-01-10 DIAGNOSIS — Z0001 Encounter for general adult medical examination with abnormal findings: Secondary | ICD-10-CM | POA: Diagnosis not present

## 2020-01-10 DIAGNOSIS — E663 Overweight: Secondary | ICD-10-CM | POA: Diagnosis not present

## 2020-01-10 DIAGNOSIS — T50905A Adverse effect of unspecified drugs, medicaments and biological substances, initial encounter: Secondary | ICD-10-CM | POA: Diagnosis not present

## 2020-01-12 ENCOUNTER — Encounter (HOSPITAL_COMMUNITY): Payer: Self-pay | Admitting: Emergency Medicine

## 2020-01-12 ENCOUNTER — Emergency Department (HOSPITAL_COMMUNITY)
Admission: EM | Admit: 2020-01-12 | Discharge: 2020-01-12 | Disposition: A | Discharge status: Home / Self Care | Payer: Medicare HMO | Attending: Emergency Medicine | Admitting: Emergency Medicine

## 2020-01-12 ENCOUNTER — Other Ambulatory Visit: Payer: Self-pay

## 2020-01-12 ENCOUNTER — Emergency Department (HOSPITAL_COMMUNITY): Payer: Medicare HMO

## 2020-01-12 DIAGNOSIS — Y9389 Activity, other specified: Secondary | ICD-10-CM | POA: Diagnosis not present

## 2020-01-12 DIAGNOSIS — Y929 Unspecified place or not applicable: Secondary | ICD-10-CM | POA: Diagnosis not present

## 2020-01-12 DIAGNOSIS — W1839XA Other fall on same level, initial encounter: Secondary | ICD-10-CM | POA: Insufficient documentation

## 2020-01-12 DIAGNOSIS — Y999 Unspecified external cause status: Secondary | ICD-10-CM | POA: Diagnosis not present

## 2020-01-12 DIAGNOSIS — R519 Headache, unspecified: Secondary | ICD-10-CM | POA: Insufficient documentation

## 2020-01-12 DIAGNOSIS — S0990XA Unspecified injury of head, initial encounter: Secondary | ICD-10-CM | POA: Diagnosis not present

## 2020-01-12 DIAGNOSIS — S0101XA Laceration without foreign body of scalp, initial encounter: Secondary | ICD-10-CM | POA: Diagnosis not present

## 2020-01-12 DIAGNOSIS — Z23 Encounter for immunization: Secondary | ICD-10-CM | POA: Insufficient documentation

## 2020-01-12 DIAGNOSIS — W19XXXA Unspecified fall, initial encounter: Secondary | ICD-10-CM

## 2020-01-12 DIAGNOSIS — I10 Essential (primary) hypertension: Secondary | ICD-10-CM | POA: Diagnosis not present

## 2020-01-12 DIAGNOSIS — Z79899 Other long term (current) drug therapy: Secondary | ICD-10-CM | POA: Diagnosis not present

## 2020-01-12 MED ORDER — TETANUS-DIPHTH-ACELL PERTUSSIS 5-2.5-18.5 LF-MCG/0.5 IM SUSP
0.5000 mL | Freq: Once | INTRAMUSCULAR | Status: AC
  Administered 2020-01-12: 0.5 mL via INTRAMUSCULAR
  Filled 2020-01-12: qty 0.5

## 2020-01-12 MED ORDER — LIDOCAINE HCL (PF) 1 % IJ SOLN
5.0000 mL | Freq: Once | INTRAMUSCULAR | Status: AC
  Administered 2020-01-12: 5 mL
  Filled 2020-01-12: qty 30

## 2020-01-12 NOTE — ED Provider Notes (Addendum)
[facility] EMERGENCY DEPARTMENT Provider Note   CSN: [id number] Arrival date & time: 01/12/20  1742     History Chief Complaint  Patient presents with  . Fall    Claire Barnett is a 84 y.o. female.  HPI Patient presents after fall.  Apparently lost her balance fell backward hitting her head.  No loss consciousness.  Not on blood thinners.  Does have a dull headache.  Swelling and bleeding to the back of her head.  No neck pain.  No extremity pain.    Past Medical History:  Diagnosis Date  . Anxiety   . History of kidney stones   . Hypertension   . S/P colonoscopy August 2007   Rare sigmoid diverticulosis.  Otherwise, no polyps, inflammatory    Patient Active Problem List   Diagnosis Date Noted  . UTI (lower urinary tract infection) 09/07/2013  . Encephalopathy acute 09/07/2013  . Influenza 09/07/2013  . HTN (hypertension) 09/07/2013  . Anxiety 09/07/2013  . Family hx of colon cancer 04/15/2011    Past Surgical History:  Procedure Laterality Date  . ABDOMINAL HYSTERECTOMY    . BREAST LUMPECTOMY Right    benign  . CATARACT EXTRACTION W/PHACO Right 02/01/2013   Procedure: CATARACT EXTRACTION PHACO AND INTRAOCULAR LENS PLACEMENT (IOC);  Surgeon: Tonny Branch, MD;  Location: [facility];  Service: Ophthalmology;  Laterality: Right;  CDE:25.80  . COLONOSCOPY  05/12/2011   Procedure: COLONOSCOPY;  Surgeon: Dorothyann Peng, MD;  Location: [facility] ENDO SUITE;  Service: Endoscopy;  Laterality: N/A;  10:30AM  . CYSTOSCOPY W/ STONE MANIPULATION     x3  . kidney stones     X 4 procedures     OB History    Gravida  1   Para      Term      Preterm      AB  1   Living        SAB  1   TAB      Ectopic      Multiple      Live Births              Family History  Problem Relation Age of Onset  . Colon cancer Sister        diagnosed late 23s.   . Colon cancer Mother        diagnosed in mid to late 87s. Deceased age 86    Social History   Tobacco Use  . Smoking  status: Never Smoker  . Smokeless tobacco: Never Used  Substance Use Topics  . Alcohol use: No  . Drug use: No    Home Medications Prior to Admission medications   Medication Sig Start Date End Date Taking? Authorizing Provider  ALPRAZolam Duanne Moron) 0.5 MG tablet Take 0.5-1 mg by mouth 3 (three) times daily. 01/10/20  Yes [provider]  ciprofloxacin (CIPRO) 500 MG tablet Take 500 mg by mouth 2 (two) times daily. 01/11/20  Yes [provider]  gabapentin (NEURONTIN) 100 MG capsule Take 100 mg by mouth 3 (three) times daily. 01/07/20  Yes [provider]  meloxicam (MOBIC) 7.5 MG tablet Take 1 tablet by mouth every 12 (twelve) hours. 01/07/20  Yes [provider]  sertraline (ZOLOFT) 25 MG tablet Take 25 mg by mouth daily. 12/18/19  Yes [provider]  atenolol (TENORMIN) 50 MG tablet Take 50 mg by mouth every morning.  04/12/11   [provider]  cholecalciferol (VITAMIN D) 1000 UNITS tablet  Take 1,000 Units by mouth daily.    [provider]  hydrochlorothiazide 25 MG tablet Take 25 mg by mouth every morning.  04/12/11   [provider]    Allergies    Patient has no known allergies.  Review of Systems   Review of Systems  Constitutional: Negative for appetite change.  HENT: Negative for congestion.   Respiratory: Negative for shortness of breath.   Cardiovascular: Negative for chest pain.  Gastrointestinal: Negative for abdominal pain.  Genitourinary: Negative for flank pain.  Musculoskeletal: Negative for neck pain.  Skin: Positive for wound.  Neurological: Positive for headaches.  Psychiatric/Behavioral: Negative for confusion.    Physical Exam Updated Vital Signs BP (!) 183/83 (BP Location: Left Arm)   Pulse 72   Temp 98.3 F (36.8 C) (Oral)   Resp 18   Ht 5' (1.524 m)   Wt 59 kg   SpO2 99%   BMI 25.39 kg/m   Physical Exam Vitals reviewed.  HENT:     Head:     Comments: Hematoma with  laceration to right occipital area.  Approximately 3 cm laceration that is somewhat T-shaped.    Mouth/Throat:     Mouth: Mucous membranes are moist.  Cardiovascular:     Rate and Rhythm: Regular rhythm.  Chest:     Chest wall: No tenderness.  Abdominal:     Tenderness: There is no abdominal tenderness.  Musculoskeletal:     Cervical back: Neck supple.     Comments: No cervical or extremity tenderness.  Skin:    General: Skin is warm.     Capillary Refill: Capillary refill takes less than 2 seconds.  Neurological:     Mental Status: She is alert and oriented to person, place, and time.     ED Results / Procedures / Treatments   Labs (all labs ordered are listed, but only abnormal results are displayed) Labs Reviewed - No data to display  EKG None  Radiology CT Head Wo Contrast  Result Date: 01/12/2020 CLINICAL DATA:  Patient has large laceration to posterior head after falling and hitting the back of head on rock. Per patient lost balance and fell backwards. Denies LOC or dizziness. Patient actively bleeding. Denies taking any type of anticoagulant. Patient does report headache. EXAM: CT HEAD WITHOUT CONTRAST TECHNIQUE: Contiguous axial images were obtained from the base of the skull through the vertex without intravenous contrast. COMPARISON:  09/07/2013 FINDINGS: Brain: No evidence of acute infarction, hemorrhage, hydrocephalus, extra-axial collection or mass lesion/mass effect. Vascular: No hyperdense vessel or unexpected calcification. Skull: Normal. Negative for fracture or focal lesion. Sinuses/Orbits: Globes and orbits are unremarkable. Wall thickening of the visualized right maxillary sinus with mucosal thickening. Scattered ethmoid air cell mucosal thickening. Clear mastoid air cells. Other: Right posterior parietooccipital scalp hematoma. No radiopaque foreign body. IMPRESSION: 1. No acute intracranial abnormalities. 2. No skull fracture. 3. Right posterior parietal/occipital  scalp laceration with hematoma. Electronically Signed   By: Amie Portland M.D.   On: 01/12/2020 18:29    Procedures .Marland KitchenLaceration Repair  Date/Time: 01/12/2020 9:00 PM Performed by: Benjiman Core, MD Authorized by: Benjiman Core, MD   Consent:    Consent obtained:  Verbal   Consent given by:  Patient   Risks discussed:  Infection, pain, vascular damage, poor wound healing, poor cosmetic result, need for additional repair and nerve damage   Alternatives discussed:  No treatment, delayed treatment and observation Anesthesia (see MAR for exact dosages):    Anesthesia method:  Local infiltration   Local anesthetic:  Lidocaine 1% w/o epi Laceration details:    Location:  Scalp   Scalp location:  Occipital   Length (cm):  3 Repair type:    Repair type:  Simple Pre-procedure details:    Preparation:  Patient was prepped and draped in usual sterile fashion Exploration:    Hemostasis achieved with:  Direct pressure   Wound exploration: wound explored through full range of motion     Wound extent: no underlying fracture noted and no vascular damage noted     Contaminated: no   Treatment:    Area cleansed with:  Saline   Amount of cleaning:  Standard   Irrigation solution:  Sterile saline   Irrigation method:  Syringe   Visualized foreign bodies/material removed: no   Skin repair:    Repair method:  Sutures   Suture size:  4-0   Suture material:  Prolene   Suture technique:  Simple interrupted   Number of sutures:  6 Approximation:    Approximation:  Close Post-procedure details:    Dressing:  Sterile dressing   Patient tolerance of procedure:  Tolerated well, no immediate complications   (including critical care time)  Medications Ordered in ED Medications  Tdap (BOOSTRIX) injection 0.5 mL (has no administration in time range)  lidocaine (PF) (XYLOCAINE) 1 % injection 5 mL (5 mLs Infiltration Given 01/12/20 2048)    ED Course  I have reviewed the triage vital  signs and the nursing notes.  Pertinent labs & imaging results that were available during my care of the patient were reviewed by me and considered in my medical decision making (see chart for details).    MDM Rules/Calculators/A&P                      Patient with fall.  Bleeding scalp laceration.  Laceration somewhat irregular and somewhat denuded.  Closed with stitches and dressing placed.  Will leave dressing on for 2 days due to the irregularity of the wound.  Tetanus has been updated.  Have stitches out in 10 to 14 days.  Head CT ordered by me and reassuring.  Does not appear to have extremity tenderness doubt other injury. Final Clinical Impression(s) / ED Diagnoses Final diagnoses:  Fall, initial encounter  Laceration of scalp, initial encounter  Minor head injury, initial encounter    Rx / DC Orders ED Discharge Orders    None       Benjiman Core, MD 01/12/20 2059    Benjiman Core, MD 01/12/20 2101

## 2020-01-12 NOTE — ED Triage Notes (Signed)
Patient has large laceration to posterior head after falling and hitting the back of head on rock. Per patient lost balance and fell backwards. Denies LOC or dizziness. Patient actively bleeding. Patient unsure of last tetanus vaccination. Denies taking any type of anticoagulant. Patient does report headache.

## 2020-01-12 NOTE — Discharge Instructions (Addendum)
Have the sutures taken out in around 10 to 14 days.

## 2020-01-12 NOTE — ED Notes (Signed)
Pt. With CT. 

## 2020-01-29 DIAGNOSIS — E663 Overweight: Secondary | ICD-10-CM | POA: Diagnosis not present

## 2020-01-29 DIAGNOSIS — Z6827 Body mass index (BMI) 27.0-27.9, adult: Secondary | ICD-10-CM | POA: Diagnosis not present

## 2020-01-29 DIAGNOSIS — T148XXA Other injury of unspecified body region, initial encounter: Secondary | ICD-10-CM | POA: Diagnosis not present

## 2020-01-29 DIAGNOSIS — Z4802 Encounter for removal of sutures: Secondary | ICD-10-CM | POA: Diagnosis not present

## 2020-04-21 DIAGNOSIS — G603 Idiopathic progressive neuropathy: Secondary | ICD-10-CM | POA: Diagnosis not present

## 2020-04-21 DIAGNOSIS — E559 Vitamin D deficiency, unspecified: Secondary | ICD-10-CM | POA: Diagnosis not present

## 2020-04-21 DIAGNOSIS — I10 Essential (primary) hypertension: Secondary | ICD-10-CM | POA: Diagnosis not present

## 2020-04-21 DIAGNOSIS — E039 Hypothyroidism, unspecified: Secondary | ICD-10-CM | POA: Diagnosis not present

## 2020-04-23 ENCOUNTER — Other Ambulatory Visit (HOSPITAL_COMMUNITY): Payer: Self-pay | Admitting: Physician Assistant

## 2020-04-23 ENCOUNTER — Ambulatory Visit (HOSPITAL_COMMUNITY)
Admission: RE | Admit: 2020-04-23 | Discharge: 2020-04-23 | Disposition: A | Discharge status: Home / Self Care | Payer: Medicare HMO | Source: Ambulatory Visit | Attending: Physician Assistant | Admitting: Physician Assistant

## 2020-04-23 ENCOUNTER — Other Ambulatory Visit: Payer: Self-pay

## 2020-04-23 DIAGNOSIS — R0789 Other chest pain: Secondary | ICD-10-CM | POA: Diagnosis not present

## 2020-04-23 DIAGNOSIS — R079 Chest pain, unspecified: Secondary | ICD-10-CM | POA: Diagnosis not present

## 2020-04-23 DIAGNOSIS — Z681 Body mass index (BMI) 19 or less, adult: Secondary | ICD-10-CM | POA: Diagnosis not present

## 2020-04-23 DIAGNOSIS — R5383 Other fatigue: Secondary | ICD-10-CM | POA: Diagnosis not present

## 2020-04-23 DIAGNOSIS — R05 Cough: Secondary | ICD-10-CM | POA: Diagnosis not present

## 2020-04-30 DIAGNOSIS — I2699 Other pulmonary embolism without acute cor pulmonale: Secondary | ICD-10-CM | POA: Diagnosis not present

## 2020-04-30 DIAGNOSIS — J9811 Atelectasis: Secondary | ICD-10-CM | POA: Diagnosis not present

## 2020-05-15 DIAGNOSIS — R319 Hematuria, unspecified: Secondary | ICD-10-CM | POA: Diagnosis not present

## 2020-06-02 DIAGNOSIS — I2699 Other pulmonary embolism without acute cor pulmonale: Secondary | ICD-10-CM | POA: Diagnosis not present

## 2020-06-02 DIAGNOSIS — E663 Overweight: Secondary | ICD-10-CM | POA: Diagnosis not present

## 2020-06-02 DIAGNOSIS — Z6826 Body mass index (BMI) 26.0-26.9, adult: Secondary | ICD-10-CM | POA: Diagnosis not present

## 2020-06-02 DIAGNOSIS — Z23 Encounter for immunization: Secondary | ICD-10-CM | POA: Diagnosis not present

## 2020-06-16 DIAGNOSIS — H524 Presbyopia: Secondary | ICD-10-CM | POA: Diagnosis not present

## 2020-06-16 DIAGNOSIS — E119 Type 2 diabetes mellitus without complications: Secondary | ICD-10-CM | POA: Diagnosis not present

## 2020-06-27 ENCOUNTER — Ambulatory Visit (INDEPENDENT_AMBULATORY_CARE_PROVIDER_SITE_OTHER): Payer: Medicare HMO | Admitting: Urology

## 2020-06-27 ENCOUNTER — Other Ambulatory Visit: Payer: Self-pay

## 2020-06-27 ENCOUNTER — Encounter: Payer: Self-pay | Admitting: Urology

## 2020-06-27 VITALS — BP 149/76 | HR 76 | Temp 98.4°F | Ht 60.0 in | Wt 130.0 lb

## 2020-06-27 DIAGNOSIS — R310 Gross hematuria: Secondary | ICD-10-CM | POA: Diagnosis not present

## 2020-06-27 LAB — URINALYSIS, ROUTINE W REFLEX MICROSCOPIC
Bilirubin, UA: NEGATIVE
Glucose, UA: NEGATIVE
Ketones, UA: NEGATIVE
Nitrite, UA: NEGATIVE
Specific Gravity, UA: 1.02 (ref 1.005–1.030)
Urobilinogen, Ur: 0.2 mg/dL (ref 0.2–1.0)
pH, UA: 5 (ref 5.0–7.5)

## 2020-06-27 LAB — MICROSCOPIC EXAMINATION: RBC: >30 /hpf — ABNORMAL HIGH (ref 0–2)

## 2020-06-27 NOTE — Progress Notes (Signed)
Urological Symptom Review  Patient is experiencing the following symptoms: Urine stream start and stop Trouble starting stream Blood in urine  History of kidney stones (1970's)   Review of Systems  Gastrointestinal (upper)  : Negative for upper GI symptoms  Gastrointestinal (lower) : Negative for lower GI symptoms  Constitutional : Negative for symptoms  Skin: Negative for skin symptoms  Eyes: Negative for eye symptoms  Ear/Nose/Throat : Negative for Ear/Nose/Throat symptoms  Hematologic/Lymphatic: Negative for Hematologic/Lymphatic symptoms  Cardiovascular : Chest pain  Respiratory : Shortness of breath  Endocrine: Negative for endocrine symptoms  Musculoskeletal: Back pain  Joint pain  Neurological: Negative for neurological symptoms  Psychologic: Depression Anxiety

## 2020-06-27 NOTE — Patient Instructions (Signed)
Hematuria, Adult Hematuria is blood in the urine. Blood may be visible in the urine, or it may be identified with a test. This condition can be caused by infections of the bladder, urethra, kidney, or prostate. Other possible causes include:  Kidney stones.  Cancer of the urinary tract.  Too much calcium in the urine.  Conditions that are passed from parent to child (inherited conditions).  Exercise that requires a lot of energy. Infections can usually be treated with medicine, and a kidney stone usually will pass through your urine. If neither of these is the cause of your hematuria, more tests may be needed to identify the cause of your symptoms. It is very important to tell your health care provider about any blood in your urine, even if it is painless or the blood stops without treatment. Blood in the urine, when it happens and then stops and then happens again, can be a symptom of a very serious condition, including cancer. There is no pain in the initial stages of many urinary cancers. Follow these instructions at home: Medicines  Take over-the-counter and prescription medicines only as told by your health care provider.  If you were prescribed an antibiotic medicine, take it as told by your health care provider. Do not stop taking the antibiotic even if you start to feel better. Eating and drinking  Drink enough fluid to keep your urine clear or pale yellow. It is recommended that you drink 3-4 quarts (2.8-3.8 L) a day. If you have been diagnosed with an infection, it is recommended that you drink cranberry juice in addition to large amounts of water.  Avoid caffeine, tea, and carbonated beverages. These tend to irritate the bladder.  Avoid alcohol because it may irritate the prostate (men). General instructions  If you have been diagnosed with a kidney stone, follow your health care provider's instructions about straining your urine to catch the stone.  Empty your bladder  often. Avoid holding urine for long periods of time.  If you are female: ? After a bowel movement, wipe from front to back and use each piece of toilet paper only once. ? Empty your bladder before and after sex.  Pay attention to any changes in your symptoms. Tell your health care provider about any changes or any new symptoms.  It is your responsibility to get your test results. Ask your health care provider, or the department performing the test, when your results will be ready.  Keep all follow-up visits as told by your health care provider. This is important. Contact a health care provider if:  You develop back pain.  You have a fever.  You have nausea or vomiting.  Your symptoms do not improve after 3 days.  Your symptoms get worse. Get help right away if:  You develop severe vomiting and are unable take medicine without vomiting.  You develop severe pain in your back or abdomen even though you are taking medicine.  You pass a large amount of blood in your urine.  You pass blood clots in your urine.  You feel very weak or like you might faint.  You faint. Summary  Hematuria is blood in the urine. It has many possible causes.  It is very important that you tell your health care provider about any blood in your urine, even if it is painless or the blood stops without treatment.  Take over-the-counter and prescription medicines only as told by your health care provider.  Drink enough fluid to keep   your urine clear or pale yellow. This information is not intended to replace advice given to you by your health care provider. Make sure you discuss any questions you have with your health care provider. Document Revised: 01/10/2019 Document Reviewed: 09/18/2016 Elsevier Patient Education  2020 Elsevier Inc.  

## 2020-06-27 NOTE — Addendum Note (Signed)
Addended by: Malen Gauze on: 06/27/2020 01:59 PM   Modules accepted: Orders

## 2020-06-27 NOTE — Progress Notes (Signed)
06/27/2020 1:51 PM   Marily Memos Domenick Gong 1936-02-15 [id number]  Referring provider: Elfredia Nevins, MD [address] [place],  [place] [address]  Gross hematuria  HPI: [name] is a 84yo here for evaluation of gross hematuria. Starting over 6 months ago she developed gross painless hematuria that worsened after she was placed on xarelto for DVT. She has an extensive hx of nephrolithiasis. She had 2 open surgeries in the 1970s and then multiple ESWL. She underwent a CT in 08/2017 which showed a 3cm left staghorn calculus. She was unaware of this CT finding. No LUTS, no dysuria.   PMH: Past Medical History:  Diagnosis Date  . Anxiety   . History of kidney stones   . Hypertension   . S/P colonoscopy August 2007   Rare sigmoid diverticulosis.  Otherwise, no polyps, inflammatory    Surgical History: Past Surgical History:  Procedure Laterality Date  . ABDOMINAL HYSTERECTOMY    . BREAST LUMPECTOMY Right    benign  . CATARACT EXTRACTION W/PHACO Right 02/01/2013   Procedure: CATARACT EXTRACTION PHACO AND INTRAOCULAR LENS PLACEMENT (IOC);  Surgeon: Gemma Payor, MD;  Location: [facility];  Service: Ophthalmology;  Laterality: Right;  CDE:25.80  . COLONOSCOPY  05/12/2011   Procedure: COLONOSCOPY;  Surgeon: Arlyce Harman, MD;  Location: [facility];  Service: Endoscopy;  Laterality: N/A;  10:30AM  . CYSTOSCOPY W/ STONE MANIPULATION     x3  . kidney stones     X 4 procedures    Home Medications:  Allergies as of 06/27/2020   No Known Allergies     Medication List       Accurate as of June 27, 2020  1:51 PM. If you have any questions, ask your nurse or doctor.        ALPRAZolam 0.5 MG tablet Commonly known as: XANAX Take 0.5-1 mg by mouth 3 (three) times daily.   atenolol 50 MG tablet Commonly known as: TENORMIN Take 50 mg by mouth every morning.   cholecalciferol 1000 units tablet Commonly known as: VITAMIN D Take 1,000 Units by mouth daily.   ciprofloxacin 500 MG  tablet Commonly known as: CIPRO Take 500 mg by mouth 2 (two) times daily.   gabapentin 100 MG capsule Commonly known as: NEURONTIN Take 100 mg by mouth 3 (three) times daily.   hydrochlorothiazide 25 MG tablet Commonly known as: HYDRODIURIL Take 25 mg by mouth every morning.   loteprednol 0.5 % ophthalmic suspension Commonly known as: LOTEMAX PLACE 1 DROP IN EACH EYE 3 TIMES DAILY.   meloxicam 7.5 MG tablet Commonly known as: MOBIC Take 1 tablet by mouth every 12 (twelve) hours.   sertraline 25 MG tablet Commonly known as: ZOLOFT Take 25 mg by mouth daily.   Xarelto 20 MG Tabs tablet Generic drug: rivaroxaban SMARTSIG:1 Tablet(s) By Mouth Every Evening       Allergies: No Known Allergies  Family History: Family History  Problem Relation Age of Onset  . Colon cancer Sister        diagnosed late 22s.   . Colon cancer Mother        diagnosed in mid to late 64s. Deceased age 12    Social History:  reports that she has never smoked. She has never used smokeless tobacco. She reports that she does not drink alcohol and does not use drugs.  ROS: All other review of systems were reviewed and are negative except what is noted above in HPI  Physical Exam: BP (!) 149/76  Pulse 76   Temp 98.4 F (36.9 C)   Ht 5' (1.524 m)   Wt 130 lb (59 kg)   BMI 25.39 kg/m   Constitutional:  Alert and oriented, No acute distress. HEENT: [place] AT, moist mucus membranes.  Trachea midline, no masses. Cardiovascular: No clubbing, cyanosis, or edema. Respiratory: Normal respiratory effort, no increased work of breathing. GI: Abdomen is soft, nontender, nondistended, no abdominal masses GU: No CVA tenderness.  Lymph: No cervical or inguinal lymphadenopathy. Skin: No rashes, bruises or suspicious lesions. Neurologic: Grossly intact, no focal deficits, moving all 4 extremities. Psychiatric: Normal mood and affect.  Laboratory Data: Lab Results  Component Value Date   WBC 5.6  09/10/2013   HGB 11.4 (L) 09/10/2013   HCT 31.3 (L) 09/10/2013   MCV 89.9 09/10/2013   PLT 140 (L) 09/10/2013    Lab Results  Component Value Date   CREATININE 1.00 09/09/2017    No results found for: PSA  No results found for: TESTOSTERONE  No results found for: HGBA1C  Urinalysis    Component Value Date/Time   COLORURINE AMBER (A) 09/07/2013 2045   APPEARANCEUR CLEAR 09/07/2013 2045   LABSPEC 1.025 09/07/2013 2045   PHURINE 6.5 09/07/2013 2045   GLUCOSEU NEGATIVE 09/07/2013 2045   HGBUR LARGE (A) 09/07/2013 2045   BILIRUBINUR NEGATIVE 09/07/2013 2045   KETONESUR 15 (A) 09/07/2013 2045   PROTEINUR >300 (A) 09/07/2013 2045   UROBILINOGEN 0.2 09/07/2013 2045   NITRITE NEGATIVE 09/07/2013 2045   LEUKOCYTESUR NEGATIVE 09/07/2013 2045    Lab Results  Component Value Date   BACTERIA FEW (A) 09/07/2013    Pertinent Imaging: CT 08/2017: Images reviewed and discussed with the patient No results found for this or any previous visit.  No results found for this or any previous visit.  No results found for this or any previous visit.  No results found for this or any previous visit.  No results found for this or any previous visit.  No results found for this or any previous visit.  No results found for this or any previous visit.  No results found for this or any previous visit.   Assessment & Plan:    1. Gross hematuria -Repeat CT stone study given hx of 3cm staghorn calculus in [address] - Urinalysis, Routine w reflex microscopic   No follow-ups on file.  Wilkie Aye, MD  [facility] [place]

## 2020-06-28 DIAGNOSIS — N183 Chronic kidney disease, stage 3 unspecified: Secondary | ICD-10-CM | POA: Diagnosis not present

## 2020-06-28 DIAGNOSIS — I129 Hypertensive chronic kidney disease with stage 1 through stage 4 chronic kidney disease, or unspecified chronic kidney disease: Secondary | ICD-10-CM | POA: Diagnosis not present

## 2020-06-30 LAB — URINE CULTURE: Organism ID, Bacteria: NO GROWTH

## 2020-07-01 ENCOUNTER — Other Ambulatory Visit: Payer: Self-pay

## 2020-07-01 ENCOUNTER — Ambulatory Visit (HOSPITAL_COMMUNITY): Payer: Medicare HMO

## 2020-07-01 DIAGNOSIS — R110 Nausea: Secondary | ICD-10-CM | POA: Diagnosis not present

## 2020-07-01 DIAGNOSIS — R42 Dizziness and giddiness: Secondary | ICD-10-CM | POA: Diagnosis not present

## 2020-07-01 DIAGNOSIS — Z681 Body mass index (BMI) 19 or less, adult: Secondary | ICD-10-CM | POA: Diagnosis not present

## 2020-07-01 DIAGNOSIS — R310 Gross hematuria: Secondary | ICD-10-CM

## 2020-07-01 NOTE — Progress Notes (Signed)
Letter mailed

## 2020-07-11 ENCOUNTER — Ambulatory Visit: Payer: Medicare HMO | Admitting: Urology

## 2020-07-16 ENCOUNTER — Other Ambulatory Visit: Payer: Self-pay

## 2020-07-16 ENCOUNTER — Encounter: Payer: Self-pay | Admitting: Urology

## 2020-07-16 ENCOUNTER — Ambulatory Visit (HOSPITAL_COMMUNITY)
Admission: RE | Admit: 2020-07-16 | Discharge: 2020-07-16 | Disposition: A | Discharge status: Home / Self Care | Payer: Medicare HMO | Source: Ambulatory Visit | Attending: Urology | Admitting: Urology

## 2020-07-16 ENCOUNTER — Ambulatory Visit (INDEPENDENT_AMBULATORY_CARE_PROVIDER_SITE_OTHER): Payer: Medicare HMO | Admitting: Urology

## 2020-07-16 VITALS — BP 150/75 | HR 84 | Temp 98.3°F | Ht 60.0 in | Wt 130.0 lb

## 2020-07-16 DIAGNOSIS — R310 Gross hematuria: Secondary | ICD-10-CM | POA: Diagnosis not present

## 2020-07-16 DIAGNOSIS — N281 Cyst of kidney, acquired: Secondary | ICD-10-CM | POA: Diagnosis not present

## 2020-07-16 DIAGNOSIS — K5730 Diverticulosis of large intestine without perforation or abscess without bleeding: Secondary | ICD-10-CM | POA: Diagnosis not present

## 2020-07-16 DIAGNOSIS — R319 Hematuria, unspecified: Secondary | ICD-10-CM | POA: Diagnosis not present

## 2020-07-16 DIAGNOSIS — N200 Calculus of kidney: Secondary | ICD-10-CM | POA: Diagnosis not present

## 2020-07-16 LAB — URINALYSIS, ROUTINE W REFLEX MICROSCOPIC
Bilirubin, UA: NEGATIVE
Glucose, UA: NEGATIVE
Nitrite, UA: NEGATIVE
Specific Gravity, UA: >1.03 — ABNORMAL HIGH (ref 1.005–1.030)
Urobilinogen, Ur: 1 mg/dL (ref 0.2–1.0)
pH, UA: 5 (ref 5.0–7.5)

## 2020-07-16 LAB — MICROSCOPIC EXAMINATION
Epithelial Cells (non renal): >10 /hpf — AB (ref 0–10)
RBC: >30 /hpf — AB (ref 0–2)
Renal Epithel, UA: NONE SEEN /hpf

## 2020-07-16 MED ORDER — TRAMADOL HCL 50 MG PO TABS: 50.0000 mg | ORAL_TABLET | Freq: Four times a day (QID) | ORAL | 0 refills | Status: DC | PRN

## 2020-07-16 NOTE — Progress Notes (Signed)
Urological Symptom Review  Patient is experiencing the following symptoms: Get up at night to urinate Blood in urine  Kidney stones   Review of Systems  Gastrointestinal (upper)  : Negative for upper GI symptoms  Gastrointestinal (lower) : Constipation  Constitutional : Fatigue  Weight loss  Skin: Negative for skin symptoms  Eyes: Negative for eye symptoms  Ear/Nose/Throat : Negative for Ear/Nose/Throat symptoms  Hematologic/Lymphatic: Negative for Hematologic/Lymphatic symptoms  Cardiovascular : Negative for cardiovascular symptoms  Respiratory : Shortness of breath  Endocrine: Negative for endocrine symptoms  Musculoskeletal: Back pain  Neurological: Negative for neurological symptoms  Psychologic: Anxiety

## 2020-07-16 NOTE — Patient Instructions (Signed)

## 2020-07-16 NOTE — Progress Notes (Signed)
07/16/2020 4:11 PM   Claire Barnett Claire Barnett 26-Aug-1936 [id number]  Referring provider: Elfredia Nevins, MD [address] [place],  [place] [address]  followup nephrolithiasis  HPI: [name] is a 84yo here for followup for nephrolithiasis. She is having intermittent left flank pain with associated gross hematuria. She underwent CT stone study which showed a 3cm left renal calculus   PMH: Past Medical History:  Diagnosis Date  . Anxiety   . History of kidney stones   . Hypertension   . S/P colonoscopy August 2007   Rare sigmoid diverticulosis.  Otherwise, no polyps, inflammatory    Surgical History: Past Surgical History:  Procedure Laterality Date  . ABDOMINAL HYSTERECTOMY    . BREAST LUMPECTOMY Right    benign  . CATARACT EXTRACTION W/PHACO Right 02/01/2013   Procedure: CATARACT EXTRACTION PHACO AND INTRAOCULAR LENS PLACEMENT (IOC);  Surgeon: Gemma Payor, MD;  Location: [facility];  Service: Ophthalmology;  Laterality: Right;  CDE:25.80  . COLONOSCOPY  05/12/2011   Procedure: COLONOSCOPY;  Surgeon: Arlyce Harman, MD;  Location: [facility];  Service: Endoscopy;  Laterality: N/A;  10:30AM  . CYSTOSCOPY W/ STONE MANIPULATION     x3  . kidney stones     X 4 procedures    Home Medications:  Allergies as of 07/16/2020   No Known Allergies     Medication List       Accurate as of July 16, 2020  4:11 PM. If you have any questions, ask your nurse or doctor.        ALPRAZolam 0.5 MG tablet Commonly known as: XANAX Take 0.5-1 mg by mouth 3 (three) times daily.   atenolol 50 MG tablet Commonly known as: TENORMIN Take 50 mg by mouth every morning.   cholecalciferol 1000 units tablet Commonly known as: VITAMIN D Take 1,000 Units by mouth daily.   ciprofloxacin 500 MG tablet Commonly known as: CIPRO Take 500 mg by mouth 2 (two) times daily.   gabapentin 100 MG capsule Commonly known as: NEURONTIN Take 100 mg by mouth 3 (three) times daily.   hydrochlorothiazide  25 MG tablet Commonly known as: HYDRODIURIL Take 25 mg by mouth every morning.   loteprednol 0.5 % ophthalmic suspension Commonly known as: LOTEMAX PLACE 1 DROP IN EACH EYE 3 TIMES DAILY.   meclizine 25 MG tablet Commonly known as: ANTIVERT Take 25 mg by mouth every 6 (six) hours as needed.   meloxicam 7.5 MG tablet Commonly known as: MOBIC Take 1 tablet by mouth every 12 (twelve) hours.   ondansetron 4 MG tablet Commonly known as: ZOFRAN Take 4-8 mg by mouth every 6 (six) hours as needed.   sertraline 25 MG tablet Commonly known as: ZOLOFT Take 25 mg by mouth daily.   Xarelto 20 MG Tabs tablet Generic drug: rivaroxaban SMARTSIG:1 Tablet(s) By Mouth Every Evening       Allergies: No Known Allergies  Family History: Family History  Problem Relation Age of Onset  . Colon cancer Sister        diagnosed late 62s.   . Colon cancer Mother        diagnosed in mid to late 9s. Deceased age 9    Social History:  reports that she has never smoked. She has never used smokeless tobacco. She reports that she does not drink alcohol and does not use drugs.  ROS: All other review of systems were reviewed and are negative except what is noted above in HPI  Physical Exam: BP Marland Kitchen)  150/75   Pulse 84   Temp 98.3 F (36.8 C)   Ht 5' (1.524 m)   Wt 130 lb (59 kg)   BMI 25.39 kg/m   Constitutional:  Alert and oriented, No acute distress. HEENT:  AT, moist mucus membranes.  Trachea midline, no masses. Cardiovascular: No clubbing, cyanosis, or edema. Respiratory: Normal respiratory effort, no increased work of breathing. GI: Abdomen is soft, nontender, nondistended, no abdominal masses GU: No CVA tenderness.  Lymph: No cervical or inguinal lymphadenopathy. Skin: No rashes, bruises or suspicious lesions. Neurologic: Grossly intact, no focal deficits, moving all 4 extremities. Psychiatric: Normal mood and affect.  Laboratory Data: Lab Results  Component Value Date   WBC  5.6 09/10/2013   HGB 11.4 (L) 09/10/2013   HCT 31.3 (L) 09/10/2013   MCV 89.9 09/10/2013   PLT 140 (L) 09/10/2013    Lab Results  Component Value Date   CREATININE 1.00 09/09/2017    No results found for: PSA  No results found for: TESTOSTERONE  No results found for: HGBA1C  Urinalysis    Component Value Date/Time   COLORURINE AMBER (A) 09/07/2013 2045   APPEARANCEUR Cloudy (A) 06/27/2020 1336   LABSPEC 1.025 09/07/2013 2045   PHURINE 6.5 09/07/2013 2045   GLUCOSEU Negative 06/27/2020 1336   HGBUR LARGE (A) 09/07/2013 2045   BILIRUBINUR Negative 06/27/2020 1336   KETONESUR 15 (A) 09/07/2013 2045   PROTEINUR 2+ (A) 06/27/2020 1336   PROTEINUR >300 (A) 09/07/2013 2045   UROBILINOGEN 0.2 09/07/2013 2045   NITRITE Negative 06/27/2020 1336   NITRITE NEGATIVE 09/07/2013 2045   LEUKOCYTESUR Trace (A) 06/27/2020 1336    Lab Results  Component Value Date   LABMICR See below: 06/27/2020   WBCUA 0-5 06/27/2020   LABEPIT 0-10 06/27/2020   BACTERIA Moderate (A) 06/27/2020    Pertinent Imaging: CT stone study 07/16/2020: Images reviewed and discussed with the patient No results found for this or any previous visit.  No results found for this or any previous visit.  No results found for this or any previous visit.  No results found for this or any previous visit.  No results found for this or any previous visit.  No results found for this or any previous visit.  No results found for this or any previous visit.  Results for orders placed during the hospital encounter of 07/16/20  CT RENAL STONE STUDY  Narrative CLINICAL DATA:  Left flank pain and hematuria for 2 months.  EXAM: CT ABDOMEN AND PELVIS WITHOUT CONTRAST  TECHNIQUE: Multidetector CT imaging of the abdomen and pelvis was performed following the standard protocol without IV contrast.  COMPARISON:  09/09/2017  FINDINGS: Lower chest: Minimal basilar scarring changes. No infiltrates or effusions.  No worrisome pulmonary lesions. The heart is mildly enlarged but stable. Stable aortic and coronary artery calcifications.  Hepatobiliary: No hepatic lesions are identified without contrast. No intrahepatic biliary dilatation. The gallbladder is normal. No common bile duct dilatation.  Pancreas: Mild pancreatic atrophy but no mass, inflammation or ductal dilatation.  Spleen: Normal size.  No focal lesions.  Adrenals/Urinary Tract: The adrenal glands are unremarkable and stable.  Again demonstrated is a large staghorn type calculus in the left renal pelvis. This measures approximately 3.3 x 3.0 x 2.5 cm. There is also a lower pole calculus measuring 8.5 mm. Probable mild chronic UPJ obstruction but no obstructing ureteral calculi.  No right-sided renal or ureteral calculi. There are bilateral simple appearing renal cysts noted.  No bladder mass or  bladder calculi are identified.  Stomach/Bowel: The stomach, duodenum, small bowel and colon are grossly normal without oral contrast. No acute inflammatory changes, mass lesions or obstructive findings. There is scattered diffuse colonic diverticulosis but no findings for acute diverticulitis. Moderate stool throughout the colon and down into the rectum could suggest constipation.  Vascular/Lymphatic: Minimal scattered aortic and branch vessel calcifications for age. No aneurysm. No mesenteric or retroperitoneal mass or adenopathy.  Reproductive: Surgically absent.  Other: No pelvic mass or adenopathy. No free pelvic fluid collections. No inguinal mass or adenopathy. No abdominal wall hernia or subcutaneous lesions. Bilateral inguinal hernias containing fat are noted.  Musculoskeletal: No acute or significant bony findings. Stable degenerative lumbar facet disease.  IMPRESSION: 1. Large staghorn type calculus in the left renal pelvis with probable mild chronic UPJ obstruction. Slight interval enlargement since 2019. 2.  Lower pole left renal calculus. 3. No obstructing ureteral calculi or bladder calculi. 4. Bilateral simple appearing renal cysts. 5. No acute abdominal/pelvic findings, mass lesions or adenopathy. 6. Moderate stool throughout the colon and down into the rectum could suggest constipation.   Electronically Signed By: Rudie Meyer M.D. On: 07/16/2020 11:10   Assessment & Plan:    1. Left staghorn calculus -We discussed the management of kidney stones. These options include observation, ureteroscopy, shockwave lithotripsy (ESWL) and percutaneous nephrolithotomy (PCNL). We discussed which options are relevant to the patient's stone(s). We discussed the natural history of kidney stones as well as the complications of untreated stones and the impact on quality of life without treatment as well as with each of the above listed treatments. We also discussed the efficacy of each treatment in its ability to clear the stone burden. With any of these management options I discussed the signs and symptoms of infection and the need for emergent treatment should these be experienced. For each option we discussed the ability of each procedure to clear the patient of their stone burden.   For observation I described the risks which include but are not limited to silent renal damage, life-threatening infection, need for emergent surgery, failure to pass stone and pain.   For ureteroscopy I described the risks which include bleeding, infection, damage to contiguous structures, positioning injury, ureteral stricture, ureteral avulsion, ureteral injury, need for prolonged ureteral stent, inability to perform ureteroscopy, need for an interval procedure, inability to clear stone burden, stent discomfort/pain, heart attack, stroke, pulmonary embolus and the inherent risks with general anesthesia.   For shockwave lithotripsy I described the risks which include arrhythmia, kidney contusion, kidney hemorrhage, need for  transfusion, pain, inability to adequately break up stone, inability to pass stone fragments, Steinstrasse, infection associated with obstructing stones, need for alternate surgical procedure, need for repeat shockwave lithotripsy, MI, CVA, PE and the inherent risks with anesthesia/conscious sedation.   For PCNL I described the risks including positioning injury, pneumothorax, hydrothorax, need for chest tube, inability to clear stone burden, renal laceration, arterial venous fistula or malformation, need for embolization of kidney, loss of kidney or renal function, need for repeat procedure, need for prolonged nephrostomy tube, ureteral avulsion, MI, CVA, PE and the inherent risks of general anesthesia.   - The patient would like to proceed with left PCNL. We will obtain clearance to stop xarelto prior to procedure - Urinalysis, Routine w reflex microscopic   No follow-ups on file.  Wilkie Aye, MD  [facility] [place]

## 2020-07-17 ENCOUNTER — Other Ambulatory Visit: Payer: Self-pay

## 2020-07-17 DIAGNOSIS — N200 Calculus of kidney: Secondary | ICD-10-CM

## 2020-07-17 NOTE — Progress Notes (Addendum)
Clearance letter sent to Dr. Tonette Lederer xarelto hold prior to pt having surgery on 12/20

## 2020-07-28 DIAGNOSIS — I10 Essential (primary) hypertension: Secondary | ICD-10-CM | POA: Diagnosis not present

## 2020-07-28 DIAGNOSIS — E559 Vitamin D deficiency, unspecified: Secondary | ICD-10-CM | POA: Diagnosis not present

## 2020-07-28 DIAGNOSIS — M130 Polyarthritis, unspecified: Secondary | ICD-10-CM | POA: Diagnosis not present

## 2020-07-28 DIAGNOSIS — G603 Idiopathic progressive neuropathy: Secondary | ICD-10-CM | POA: Diagnosis not present

## 2020-07-28 DIAGNOSIS — E039 Hypothyroidism, unspecified: Secondary | ICD-10-CM | POA: Diagnosis not present

## 2020-08-02 DIAGNOSIS — Y92481 Parking lot as the place of occurrence of the external cause: Secondary | ICD-10-CM | POA: Diagnosis not present

## 2020-08-02 DIAGNOSIS — S8012XA Contusion of left lower leg, initial encounter: Secondary | ICD-10-CM | POA: Diagnosis not present

## 2020-08-02 DIAGNOSIS — S8011XA Contusion of right lower leg, initial encounter: Secondary | ICD-10-CM | POA: Diagnosis not present

## 2020-08-02 DIAGNOSIS — I10 Essential (primary) hypertension: Secondary | ICD-10-CM | POA: Diagnosis not present

## 2020-08-02 DIAGNOSIS — S8010XA Contusion of unspecified lower leg, initial encounter: Secondary | ICD-10-CM | POA: Diagnosis not present

## 2020-08-12 ENCOUNTER — Telehealth: Payer: Self-pay

## 2020-08-12 NOTE — Patient Instructions (Signed)
Your procedure is scheduled on: 08/18/2020  Report to [facility] at   9:00  AM.  Call this number if you have problems the morning of surgery: [phone]   Remember:   Do not Eat or Drink after midnight         No Smoking the morning of surgery  :  Take these medicines the morning of surgery with A SIP OF WATER: Zoloft    Gabapentin, xanax, tramadol, zofran  If needed   Do not wear jewelry, make-up or nail polish.  Do not wear lotions, powders, or perfumes. You may wear deodorant.  Do not shave 48 hours prior to surgery. Men may shave face and neck.  Do not bring valuables to the hospital.  Contacts, dentures or bridgework may not be worn into surgery.  Leave suitcase in the car. After surgery it may be brought to your room.  For patients admitted to the hospital, checkout time is 11:00 AM the day of discharge.   Patients discharged the day of surgery will not be allowed to drive home.    Special Instructions: Shower using CHG night before surgery and shower the day of surgery use CHG.  Use special wash - you have one bottle of CHG for all showers.  You should use approximately 1/2 of the bottle for each shower.  How to Use Chlorhexidine for Bathing Chlorhexidine gluconate (CHG) is a germ-killing (antiseptic) solution that is used to clean the skin. It can get rid of the bacteria that normally live on the skin and can keep them away for about 24 hours. To clean your skin with CHG, you may be given:  A CHG solution to use in the shower or as part of a sponge bath.  A prepackaged cloth that contains CHG. Cleaning your skin with CHG may help lower the risk for infection:  While you are staying in the intensive care unit of the hospital.  If you have a vascular access, such as a central line, to provide short-term or long-term access to your veins.  If you have a catheter to drain urine from your bladder.  If you are on a ventilator. A ventilator is a machine that helps you  breathe by moving air in and out of your lungs.  After surgery. What are the risks? Risks of using CHG include:  A skin reaction.  Hearing loss, if CHG gets in your ears.  Eye injury, if CHG gets in your eyes and is not rinsed out.  The CHG product catching fire. Make sure that you avoid smoking and flames after applying CHG to your skin. Do not use CHG:  If you have a chlorhexidine allergy or have previously reacted to chlorhexidine.  On babies younger than 28 months of age. How to use CHG solution  Use CHG only as told by your health care provider, and follow the instructions on the label.  Use the full amount of CHG as directed. Usually, this is one bottle. During a shower Follow these steps when using CHG solution during a shower (unless your health care provider gives you different instructions): 1. Start the shower. 2. Use your normal soap and shampoo to wash your face and hair. 3. Turn off the shower or move out of the shower stream. 4. Pour the CHG onto a clean washcloth. Do not use any type of brush or rough-edged sponge. 5. Starting at your neck, lather your body down to your toes. Make sure you follow these instructions: ?  If you will be having surgery, pay special attention to the part of your body where you will be having surgery. Scrub this area for at least 1 minute. ? Do not use CHG on your head or face. If the solution gets into your ears or eyes, rinse them well with water. ? Avoid your genital area. ? Avoid any areas of skin that have broken skin, cuts, or scrapes. ? Scrub your back and under your arms. Make sure to wash skin folds. 6. Let the lather sit on your skin for 1-2 minutes or as long as told by your health care provider. 7. Thoroughly rinse your entire body in the shower. Make sure that all body creases and crevices are rinsed well. 8. Dry off with a clean towel. Do not put any substances on your body afterward--such as powder, lotion, or  perfume--unless you are told to do so by your health care provider. Only use lotions that are recommended by the manufacturer. 9. Put on clean clothes or pajamas. 10. If it is the night before your surgery, sleep in clean sheets.  During a sponge bath Follow these steps when using CHG solution during a sponge bath (unless your health care provider gives you different instructions): 1. Use your normal soap and shampoo to wash your face and hair. 2. Pour the CHG onto a clean washcloth. 3. Starting at your neck, lather your body down to your toes. Make sure you follow these instructions: ? If you will be having surgery, pay special attention to the part of your body where you will be having surgery. Scrub this area for at least 1 minute. ? Do not use CHG on your head or face. If the solution gets into your ears or eyes, rinse them well with water. ? Avoid your genital area. ? Avoid any areas of skin that have broken skin, cuts, or scrapes. ? Scrub your back and under your arms. Make sure to wash skin folds. 4. Let the lather sit on your skin for 1-2 minutes or as long as told by your health care provider. 5. Using a different clean, wet washcloth, thoroughly rinse your entire body. Make sure that all body creases and crevices are rinsed well. 6. Dry off with a clean towel. Do not put any substances on your body afterward--such as powder, lotion, or perfume--unless you are told to do so by your health care provider. Only use lotions that are recommended by the manufacturer. 7. Put on clean clothes or pajamas. 8. If it is the night before your surgery, sleep in clean sheets. How to use CHG prepackaged cloths  Only use CHG cloths as told by your health care provider, and follow the instructions on the label.  Use the CHG cloth on clean, dry skin.  Do not use the CHG cloth on your head or face unless your health care provider tells you to.  When washing with the CHG cloth: ? Avoid your genital  area. ? Avoid any areas of skin that have broken skin, cuts, or scrapes. Before surgery Follow these steps when using a CHG cloth to clean before surgery (unless your health care provider gives you different instructions): 1. Using the CHG cloth, vigorously scrub the part of your body where you will be having surgery. Scrub using a back-and-forth motion for 3 minutes. The area on your body should be completely wet with CHG when you are done scrubbing. 2. Do not rinse. Discard the cloth and let the area air-dry.  Do not put any substances on the area afterward, such as powder, lotion, or perfume. 3. Put on clean clothes or pajamas. 4. If it is the night before your surgery, sleep in clean sheets.  For general bathing Follow these steps when using CHG cloths for general bathing (unless your health care provider gives you different instructions). 1. Use a separate CHG cloth for each area of your body. Make sure you wash between any folds of skin and between your fingers and toes. Wash your body in the following order, switching to a new cloth after each step: ? The front of your neck, shoulders, and chest. ? Both of your arms, under your arms, and your hands. ? Your stomach and groin area, avoiding the genitals. ? Your right leg and foot. ? Your left leg and foot. ? The back of your neck, your back, and your buttocks. 2. Do not rinse. Discard the cloth and let the area air-dry. Do not put any substances on your body afterward--such as powder, lotion, or perfume--unless you are told to do so by your health care provider. Only use lotions that are recommended by the manufacturer. 3. Put on clean clothes or pajamas. Contact a health care provider if:  Your skin gets irritated after scrubbing.  You have questions about using your solution or cloth. Get help right away if:  Your eyes become very red or swollen.  Your eyes itch badly.  Your skin itches badly and is red or swollen.  Your  hearing changes.  You have trouble seeing.  You have swelling or tingling in your mouth or throat.  You have trouble breathing.  You swallow any chlorhexidine. Summary  Chlorhexidine gluconate (CHG) is a germ-killing (antiseptic) solution that is used to clean the skin. Cleaning your skin with CHG may help to lower your risk for infection.  You may be given CHG to use for bathing. It may be in a bottle or in a prepackaged cloth to use on your skin. Carefully follow your health care provider's instructions and the instructions on the product label.  Do not use CHG if you have a chlorhexidine allergy.  Contact your health care provider if your skin gets irritated after scrubbing. This information is not intended to replace advice given to you by your health care provider. Make sure you discuss any questions you have with your health care provider. Document Revised: 11/02/2018 Document Reviewed: 07/14/2017 Elsevier Patient Education  2020 Elsevier In Percutaneous Nephrolithotomy, Care After This sheet gives you information about how to care for yourself after your procedure. Your health care provider may also give you more specific instructions. If you have problems or questions, contact your health care provider. What can I expect after the procedure? After the procedure, it is common to have:  Soreness or pain.  A small amount of blood or clear fluid coming from your incision for a few days.  Fatigue.  Some blood in your urine. This will last for a few days. Follow these instructions at home: Medicines  Take over-the-counter and prescription medicines only as told by your health care provider.  If you were prescribed an antibiotic medicine, take it as told by your health care provider. Do not stop using the antibiotic even if you start to feel better.  Ask your health care provider if the medicine prescribed to you: ? Requires you to avoid driving or using heavy  machinery. ? Can cause constipation. You may need to take these actions to prevent  or treat constipation:  Drink enough fluid to keep your urine pale yellow.  Take over-the-counter or prescription medicines.  Eat foods that are high in fiber, such as beans, whole grains, and fresh fruits and vegetables.  Limit foods that are high in fat and processed sugars, such as fried or sweet foods. Incision care   Follow instructions from your health care provider about how to take care of your incision. Make sure you: ? Wash your hands with soap and water before and after you change your bandage (dressing). If soap and water are not available, use hand sanitizer. ? Change your dressing as told by your health care provider. ? Leave stitches (sutures), skin glue, or adhesive strips in place. These skin closures may need to stay in place for 2 weeks or longer. If adhesive strip edges start to loosen and curl up, you may trim the loose edges. Do not remove adhesive strips completely unless your health care provider tells you to do that.  Check your incision area every day for signs of infection. Check for: ? Redness, swelling, or pain. ? More fluid or blood. ? Warmth. ? Pus or a bad smell.  Do not take baths, swim, or use a hot tub until your health care provider approves. Ask your health care provider if you may take showers. You may only be allowed to take sponge baths. Activity  Avoid strenuous activities for as long as told by your health care provider.  Return to your normal activities as told by your health care provider. Ask your health care provider what activities are safe for you. General instructions  If you were sent home with a catheter or surgical drain (nephrostomy tube), follow your health care provider's instructions on how to take care of it.  Wear compression stockings as told by your health care provider. These stockings help to prevent blood clots and reduce swelling in your  legs.  Do not use any products that contain nicotine or tobacco, such as cigarettes, e-cigarettes, and chewing tobacco. These can delay incision healing after surgery. If you need help quitting, ask your health care provider.  Keep all follow-up visits as told by your health care provider. This is important. ? If you still have a stent, your health care provider will need to remove it after 1-2 weeks. Contact a health care provider if:  You have a fever.  You have redness, swelling, or pain around your incision.  You have more fluid or blood coming from your incision.  Your incision feels warm to the touch.  You have pus or a bad smell coming from your incision.  You lose your appetite.  You feel nauseous or you vomit.  You have been sent home with a urinary catheter or a surgical drain and urine flow suddenly stops, followed by kidney pain. Get help right away if:  You notice a large amount of blood in your urine.  You have blood clots in your urine.  You cannot urinate.  You have chest pain or trouble breathing. Summary  After the procedure, it is common to feel soreness and discomfort. You may also see blood in your urine.  You will be told how to care for yourself after the procedure. Follow instructions on how to care for your incision and how to recognize signs of infection.  Avoid activities that require great effort. Return to activities as told by your health care provider.  Get help right away if you have blood clots in  your urine, you cannot urinate, or you have trouble breathing. This information is not intended to replace advice given to you by your health care provider. Make sure you discuss any questions you have with your health care provider. Document Revised: 03/08/2018 Document Reviewed: 03/08/2018 Elsevier Patient Education  2020 Elsevier Inc.   General Anesthesia, Adult, Care After This sheet gives you information about how to care for yourself  after your procedure. Your health care provider may also give you more specific instructions. If you have problems or questions, contact your health care provider. What can I expect after the procedure? After the procedure, the following side effects are common:  Pain or discomfort at the IV site.  Nausea.  Vomiting.  Sore throat.  Trouble concentrating.  Feeling cold or chills.  Weak or tired.  Sleepiness and fatigue.  Soreness and body aches. These side effects can affect parts of the body that were not involved in surgery. Follow these instructions at home:  For at least 24 hours after the procedure:  Have a responsible adult stay with you. It is important to have someone help care for you until you are awake and alert.  Rest as needed.  Do not: ? Participate in activities in which you could fall or become injured. ? Drive. ? Use heavy machinery. ? Drink alcohol. ? Take sleeping pills or medicines that cause drowsiness. ? Make important decisions or sign legal documents. ? Take care of children on your own. Eating and drinking  Follow any instructions from your health care provider about eating or drinking restrictions.  When you feel hungry, start by eating small amounts of foods that are soft and easy to digest (bland), such as toast. Gradually return to your regular diet.  Drink enough fluid to keep your urine pale yellow.  If you vomit, rehydrate by drinking water, juice, or clear broth. General instructions  If you have sleep apnea, surgery and certain medicines can increase your risk for breathing problems. Follow instructions from your health care provider about wearing your sleep device: ? Anytime you are sleeping, including during daytime naps. ? While taking prescription pain medicines, sleeping medicines, or medicines that make you drowsy.  Return to your normal activities as told by your health care provider. Ask your health care provider what  activities are safe for you.  Take over-the-counter and prescription medicines only as told by your health care provider.  If you smoke, do not smoke without supervision.  Keep all follow-up visits as told by your health care provider. This is important. Contact a health care provider if:  You have nausea or vomiting that does not get better with medicine.  You cannot eat or drink without vomiting.  You have pain that does not get better with medicine.  You are unable to pass urine.  You develop a skin rash.  You have a fever.  You have redness around your IV site that gets worse. Get help right away if:  You have difficulty breathing.  You have chest pain.  You have blood in your urine or stool, or you vomit blood. Summary  After the procedure, it is common to have a sore throat or nausea. It is also common to feel tired.  Have a responsible adult stay with you for the first 24 hours after general anesthesia. It is important to have someone help care for you until you are awake and alert.  When you feel hungry, start by eating small amounts of foods  and easy to digest (bland), such as toast. Gradually return to your regular diet.  Drink enough fluid to keep your urine pale yellow.  Return to your normal activities as told by your health care provider. Ask your health care provider what activities are safe for you. This information is not intended to replace advice given to you by your health care provider. Make sure you discuss any questions you have with your health care provider. Document Revised: 08/19/2017 Document Reviewed: 04/01/2017 Elsevier Patient Education  2020 Elsevier Inc.  

## 2020-08-12 NOTE — Telephone Encounter (Signed)
Received a message from our office staff Stanton Kidney she received call pt would need to see pulmonary for clearance for her xarelto.  I called office to clarify message of needing surgical clearance from Dr. Sherwood Gambler.  She will have Dr. Phillips Odor look over the patient chart and call our office back.  I expressed urgency of this message as pt is due for nephrostomy tub to be placed 12/17 and must stop xarelto today.  Clearance has been sent beginning of this month.

## 2020-08-13 ENCOUNTER — Telehealth: Payer: Self-pay

## 2020-08-13 NOTE — Telephone Encounter (Signed)
Opened in error

## 2020-08-13 NOTE — Telephone Encounter (Signed)
I received a phone call from this office on 14th in early morning. Lillia Abed called from [facility] saying dr wanted [place] to know that this pt had a Pulmonary Embolus a few months ago. Because of this they said we needed to check with a Pulmonologist before pts sx. I passed message along to [facility]. Marchelle Folks looked in Epic and could find a Diplomatic Services operational officer listed. She sent messages to [facility] asking them and no one responded. She had asked them to call her back because pt was due to go off blood thinner today for sx. I walked over to talk with someone. I was only able to speak with receptionist because she said Lillia Abed and others were gone for the day. Receptionist verified original message, said that Lenise Herald treated pt for Pulmonary embolus ( something on Note of 10/4), read notes from [facility] Said someone that handles those messages had been communicating with it saying they were checking into it. I asked could she task person to call us back in the morning because we were in limbo as to pts scheduled surgery. She said we could call back at lunch tomorrow because more people involved would be here then.

## 2020-08-14 ENCOUNTER — Encounter (HOSPITAL_COMMUNITY): Payer: Self-pay

## 2020-08-14 ENCOUNTER — Other Ambulatory Visit: Payer: Self-pay | Admitting: Student

## 2020-08-14 ENCOUNTER — Encounter (HOSPITAL_COMMUNITY)
Admission: RE | Admit: 2020-08-14 | Discharge: 2020-08-14 | Disposition: A | Discharge status: Home / Self Care | Payer: Medicare HMO | Source: Ambulatory Visit | Attending: Urology | Admitting: Urology

## 2020-08-14 ENCOUNTER — Other Ambulatory Visit: Payer: Self-pay

## 2020-08-14 ENCOUNTER — Other Ambulatory Visit (HOSPITAL_COMMUNITY)
Admission: RE | Admit: 2020-08-14 | Discharge: 2020-08-14 | Disposition: A | Discharge status: Home / Self Care | Payer: Medicare HMO | Source: Ambulatory Visit | Attending: Urology | Admitting: Urology

## 2020-08-14 DIAGNOSIS — I10 Essential (primary) hypertension: Secondary | ICD-10-CM | POA: Insufficient documentation

## 2020-08-14 DIAGNOSIS — Z01818 Encounter for other preprocedural examination: Secondary | ICD-10-CM | POA: Insufficient documentation

## 2020-08-14 DIAGNOSIS — Z20822 Contact with and (suspected) exposure to covid-19: Secondary | ICD-10-CM | POA: Diagnosis not present

## 2020-08-14 LAB — CBC WITH DIFFERENTIAL/PLATELET
Abs Immature Granulocytes: 0.01 10*3/uL (ref 0.00–0.07)
Basophils Absolute: 0 10*3/uL (ref 0.0–0.1)
Basophils Relative: 1 %
Eosinophils Absolute: 0.1 10*3/uL (ref 0.0–0.5)
Eosinophils Relative: 2 %
HCT: 35.1 % — ABNORMAL LOW (ref 36.0–46.0)
Hemoglobin: 11.5 g/dL — ABNORMAL LOW (ref 12.0–15.0)
Immature Granulocytes: 0 %
Lymphocytes Relative: 32 %
Lymphs Abs: 1.3 10*3/uL (ref 0.7–4.0)
MCH: 30.4 pg (ref 26.0–34.0)
MCHC: 32.8 g/dL (ref 30.0–36.0)
MCV: 92.9 fL (ref 80.0–100.0)
Monocytes Absolute: 0.3 10*3/uL (ref 0.1–1.0)
Monocytes Relative: 7 %
Neutro Abs: 2.2 10*3/uL (ref 1.7–7.7)
Neutrophils Relative %: 58 %
Platelets: 210 10*3/uL (ref 150–400)
RBC: 3.78 MIL/uL — ABNORMAL LOW (ref 3.87–5.11)
RDW: 11.9 % (ref 11.5–15.5)
WBC: 3.9 10*3/uL — ABNORMAL LOW (ref 4.0–10.5)
nRBC: 0 % (ref 0.0–0.2)

## 2020-08-14 LAB — BASIC METABOLIC PANEL
Anion gap: 7 (ref 5–15)
BUN: 12 mg/dL (ref 8–23)
CO2: 32 mmol/L (ref 22–32)
Calcium: 9.5 mg/dL (ref 8.9–10.3)
Chloride: 98 mmol/L (ref 98–111)
Creatinine, Ser: 0.91 mg/dL (ref 0.44–1.00)
GFR, Estimated: >60 mL/min (ref >60)
Glucose, Bld: 103 mg/dL — ABNORMAL HIGH (ref 70–99)
Potassium: 3.2 mmol/L — ABNORMAL LOW (ref 3.5–5.1)
Sodium: 137 mmol/L (ref 135–145)

## 2020-08-14 LAB — SARS CORONAVIRUS 2 (TAT 6-24 HRS): SARS Coronavirus 2: NEGATIVE

## 2020-08-14 NOTE — Pre-Procedure Instructions (Signed)
Medical history and EKG reviewed with Dr Johnnette Litter. Patient is to have nephrostomy tube placed at IR tomorrow. Dr Johnnette Litter is okay to proceed with surgery.

## 2020-08-15 ENCOUNTER — Ambulatory Visit (HOSPITAL_COMMUNITY)
Admission: RE | Admit: 2020-08-15 | Discharge: 2020-08-15 | Disposition: A | Discharge status: Home / Self Care | Payer: Medicare HMO | Source: Ambulatory Visit | Attending: Urology | Admitting: Urology

## 2020-08-15 ENCOUNTER — Encounter (HOSPITAL_COMMUNITY): Payer: Self-pay

## 2020-08-15 DIAGNOSIS — F419 Anxiety disorder, unspecified: Secondary | ICD-10-CM | POA: Insufficient documentation

## 2020-08-15 DIAGNOSIS — Z79899 Other long term (current) drug therapy: Secondary | ICD-10-CM | POA: Insufficient documentation

## 2020-08-15 DIAGNOSIS — Z7901 Long term (current) use of anticoagulants: Secondary | ICD-10-CM | POA: Insufficient documentation

## 2020-08-15 DIAGNOSIS — Z86711 Personal history of pulmonary embolism: Secondary | ICD-10-CM | POA: Insufficient documentation

## 2020-08-15 DIAGNOSIS — R231 Pallor: Secondary | ICD-10-CM | POA: Diagnosis not present

## 2020-08-15 DIAGNOSIS — I10 Essential (primary) hypertension: Secondary | ICD-10-CM | POA: Diagnosis not present

## 2020-08-15 DIAGNOSIS — R4020 Unspecified coma: Secondary | ICD-10-CM | POA: Diagnosis not present

## 2020-08-15 DIAGNOSIS — N200 Calculus of kidney: Secondary | ICD-10-CM | POA: Insufficient documentation

## 2020-08-15 DIAGNOSIS — R55 Syncope and collapse: Secondary | ICD-10-CM | POA: Diagnosis not present

## 2020-08-15 HISTORY — PX: IR NEPHROSTOMY PLACEMENT LEFT: IMG6063

## 2020-08-15 LAB — CBC
HCT: 34.4 % — ABNORMAL LOW (ref 36.0–46.0)
Hemoglobin: 11.5 g/dL — ABNORMAL LOW (ref 12.0–15.0)
MCH: 30.7 pg (ref 26.0–34.0)
MCHC: 33.4 g/dL (ref 30.0–36.0)
MCV: 91.7 fL (ref 80.0–100.0)
Platelets: 202 10*3/uL (ref 150–400)
RBC: 3.75 MIL/uL — ABNORMAL LOW (ref 3.87–5.11)
RDW: 12 % (ref 11.5–15.5)
WBC: 4.8 10*3/uL (ref 4.0–10.5)
nRBC: 0 % (ref 0.0–0.2)

## 2020-08-15 LAB — PROTIME-INR
INR: 1.2 (ref 0.8–1.2)
Prothrombin Time: 14.7 seconds (ref 11.4–15.2)

## 2020-08-15 MED ORDER — SODIUM CHLORIDE 0.9 % IV SOLN: INTRAVENOUS | Status: DC

## 2020-08-15 MED ORDER — LIDOCAINE HCL 1 % IJ SOLN
INTRAMUSCULAR | Status: AC
  Filled 2020-08-15: qty 20

## 2020-08-15 MED ORDER — FENTANYL CITRATE (PF) 100 MCG/2ML IJ SOLN
INTRAMUSCULAR | Status: AC | PRN
Start: 2020-08-15 — End: 2020-08-15
  Administered 2020-08-15 (×2): 25 ug via INTRAVENOUS

## 2020-08-15 MED ORDER — CIPROFLOXACIN IN D5W 400 MG/200ML IV SOLN
INTRAVENOUS | Status: AC
  Administered 2020-08-15: 13:00:00 400 mg via INTRAVENOUS
  Filled 2020-08-15: qty 200

## 2020-08-15 MED ORDER — FENTANYL CITRATE (PF) 100 MCG/2ML IJ SOLN
INTRAMUSCULAR | Status: AC
  Filled 2020-08-15: qty 2

## 2020-08-15 MED ORDER — IOHEXOL 300 MG/ML  SOLN
50.0000 mL | Freq: Once | INTRAMUSCULAR | Status: AC | PRN
  Administered 2020-08-15: 13:00:00 25 mL

## 2020-08-15 MED ORDER — LIDOCAINE HCL (PF) 1 % IJ SOLN
INTRAMUSCULAR | Status: AC | PRN
  Administered 2020-08-15: 10 mL via INTRADERMAL

## 2020-08-15 MED ORDER — MIDAZOLAM HCL 2 MG/2ML IJ SOLN
INTRAMUSCULAR | Status: AC | PRN
  Administered 2020-08-15 (×2): 0.5 mg via INTRAVENOUS

## 2020-08-15 MED ORDER — MIDAZOLAM HCL 2 MG/2ML IJ SOLN
INTRAMUSCULAR | Status: AC
  Filled 2020-08-15: qty 4

## 2020-08-15 MED ORDER — CIPROFLOXACIN IN D5W 400 MG/200ML IV SOLN: 400.0000 mg | Freq: Once | INTRAVENOUS | Status: AC

## 2020-08-15 NOTE — Consult Note (Addendum)
Chief Complaint: Patient was seen in consultation today for left percutaneous nephrostomy/nephroureteral catheter placement  Referring Physician(s): McKenzie,Patrick L  Supervising Physician: Malachy Moan  Patient Status: [facility] - Out-pt  History of Present Illness: Claire Barnett is an 84 y.o. female with history of anxiety, hypertension, small left lower lobe PE in 04/2020- on xarelto and nephrolithiasis with associated intermittent left flank discomfort and hematuria who presents today for left percutaneous nephrostomy/nephroureteral catheter placement prior to  nephrolithotomy on 08/18/20.  Previous imaging has revealed:  1. Large staghorn type calculus in the left renal pelvis with probable mild chronic UPJ obstruction. Slight interval enlargement since 2019. 2. Lower pole left renal calculus. 3. No obstructing ureteral calculi or bladder calculi. 4. Bilateral simple appearing renal cysts. 5. No acute abdominal/pelvic findings, mass lesions or adenopathy. 6. Moderate stool throughout the colon and down into the rectum could suggest constipation  Past Medical History:  Diagnosis Date  . Anxiety   . History of kidney stones   . Hypertension   . S/P colonoscopy August 2007   Rare sigmoid diverticulosis.  Otherwise, no polyps, inflammatory    Past Surgical History:  Procedure Laterality Date  . ABDOMINAL HYSTERECTOMY    . BREAST LUMPECTOMY Right    benign  . CATARACT EXTRACTION W/PHACO Right 02/01/2013   Procedure: CATARACT EXTRACTION PHACO AND INTRAOCULAR LENS PLACEMENT (IOC);  Surgeon: Gemma Payor, MD;  Location: [facility] ORS;  Service: Ophthalmology;  Laterality: Right;  CDE:25.80  . COLONOSCOPY  05/12/2011   Procedure: COLONOSCOPY;  Surgeon: Arlyce Harman, MD;  Location: [facility] ENDO SUITE;  Service: Endoscopy;  Laterality: N/A;  10:30AM  . CYSTOSCOPY W/ STONE MANIPULATION     x3  . kidney stones     X 4 procedures    Allergies: Patient has no known  allergies.  Medications: Prior to Admission medications   Medication Sig Start Date End Date Taking? Authorizing Provider  ALPRAZolam Prudy Feeler) 0.5 MG tablet Take 0.5-1 mg by mouth 3 (three) times daily. 01/10/20  Yes [provider]  cholecalciferol (VITAMIN D) 1000 UNITS tablet Take 1,000 Units by mouth daily.   Yes [provider]  gabapentin (NEURONTIN) 100 MG capsule Take 100 mg by mouth 3 (three) times daily. 01/07/20  Yes [provider]  hydrochlorothiazide 25 MG tablet Take 25 mg by mouth every morning. 04/12/11  Yes [provider]  sertraline (ZOLOFT) 25 MG tablet Take 25 mg by mouth daily. 12/18/19  Yes [provider]  traMADol (ULTRAM) 50 MG tablet Take 1 tablet (50 mg total) by mouth every 6 (six) hours as needed. 07/16/20  Yes McKenzie, Mardene Celeste, MD  meclizine (ANTIVERT) 25 MG tablet Take 25 mg by mouth every 6 (six) hours as needed for dizziness. 07/01/20   [provider]  ondansetron (ZOFRAN) 4 MG tablet Take 4-8 mg by mouth every 6 (six) hours as needed for nausea or vomiting. 07/01/20   [provider]  XARELTO 20 MG TABS tablet Take 20 mg by mouth every evening. 06/02/20   [provider]     Family History  Problem Relation Age of Onset  . Colon cancer Sister        diagnosed late 32s.   . Colon cancer Mother        diagnosed in mid to late 79s. Deceased age 70    Social History   Socioeconomic History  . Marital status: Married    Spouse name: Not on file  . Number of children: Not  on file  . Years of education: Not on file  . Highest education level: Not on file  Occupational History  . 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 Smoking status: Never Smoker  . Smokeless tobacco: Never Used  Vaping Use  . Vaping Use: Never used  Substance and Sexual Activity  . Alcohol use: No  . Drug use: No  . Sexual activity: Yes    Birth  control/protection: Surgical  Other Topics Concern  . Not on file  Social History Narrative  . Not on file   Social Determinants of Health   Financial Resource Strain: Not on file  Food Insecurity: Not on file  Transportation Needs: Not on file  Physical Activity: Not on file  Stress: Not on file  Social Connections: Not on file      Review of Systems denies fever, headache, chest pain, dyspnea, cough, abdominal pain, nausea, vomiting  Vital Signs: BP 131/80   Pulse 75   Temp 98.5 F (36.9 C) (Oral)   Resp 16   SpO2 98%   Physical Exam awake, alert.  Chest clear to auscultation bilaterally.  Heart with regular rate and rhythm.  Abdomen soft, positive bowel sounds, nontender.  No lower extremity edema.  Imaging: No results found.  Labs:  CBC: Recent Labs    08/14/20 1003 08/15/20 0953  WBC 3.9* 4.8  HGB 11.5* 11.5*  HCT 35.1* 34.4*  PLT 210 202    COAGS: Recent Labs    08/15/20 0953  INR 1.2    BMP: Recent Labs    08/14/20 1003  NA 137  K 3.2*  CL 98  CO2 32  GLUCOSE 103*  BUN 12  CALCIUM 9.5  CREATININE 0.91  GFRNONAA >60    LIVER FUNCTION TESTS: No results for input(s): BILITOT, AST, ALT, ALKPHOS, PROT, ALBUMIN in the last 8760 hours.  TUMOR MARKERS: No results for input(s): AFPTM, CEA, CA199, CHROMGRNA in the last 8760 hours.  Assessment and Plan: 84 y.o. female with history of anxiety, hypertension, small left lower lobe PE in 04/2020- on xarelto and nephrolithiasis with associated intermittent left flank discomfort and hematuria who presents today for left percutaneous nephrostomy/nephroureteral catheter placement prior to  nephrolithotomy on 08/18/20.  Previous imaging has revealed:  1. Large staghorn type calculus in the left renal pelvis with probable mild chronic UPJ obstruction. Slight interval enlargement since 2019. 2. Lower pole left renal calculus. 3. No obstructing ureteral calculi or bladder calculi. 4. Bilateral simple  appearing renal cysts. 5. No acute abdominal/pelvic findings, mass lesions or adenopathy. 6. Moderate stool throughout the colon and down into the rectum could suggest constipation  Risks and benefits of left PCN/nephroureteral catheter  placement was discussed with the patient including, but not limited to, infection, bleeding, significant bleeding causing loss or decrease in renal function or damage to adjacent structures.   All of the patient's questions were answered, patient is agreeable to proceed.  Consent signed and in chart.      Thank you for this interesting consult.  I greatly enjoyed meeting Claire Barnett and look forward to participating in their care.  A copy of this report was sent to the requesting provider on this date.  Electronically Signed: D. Jeananne Rama, PA-C 08/15/2020, 11:06 AM   I spent a total of 25 minutes in face to face in clinical consultation, greater than 50% of which was counseling/coordinating care for left percutaneous nephrostomy/nephroureteral  catheter placement

## 2020-08-15 NOTE — Discharge Instructions (Signed)
Percutaneous Nephrostomy, Care After This sheet gives you information about how to care for yourself after your procedure. Your health care provider may also give you more specific instructions. If you have problems or questions, contact your health care provider. What can I expect after the procedure? After the procedure, it is common to have:  Some soreness where the nephrostomy tube was inserted (tube insertion site).  Blood-tinged drainage from the nephrostomy tube for the first 24 hours. Follow these instructions at home: Activity  Return to your normal activities as told by your health care provider. Ask your health care provider what activities are safe for you.  Avoid activities that may cause the nephrostomy tubing to bend.  Do not take baths, swim, or use a hot tub until your health care provider approves. Ask your health care provider if you can take showers. Cover the nephrostomy tube dressing with a watertight covering when you take a shower.  Donot drive for 24 hours if you were given a medicine to help you relax (sedative). Care of the tube insertion site   Follow instructions from your health care provider about how to take care of your tube insertion site. Make sure you: ? Wash your hands with soap and water before you change your bandage (dressing). If soap and water are not available, use hand sanitizer. ? Change your dressing as told by your health care provider. Be careful not to pull on the tube while removing the dressing. ? When you change the dressing, wash the skin around the tube, rinse well, and pat the skin dry.  Check the tube insertion area every day for signs of infection. Check for: ? More redness, swelling, or pain. ? More fluid or blood. ? Warmth. ? Pus or a bad smell. Care of the nephrostomy tube and drainage bag  Always keep the tubing, the leg bag, or the bedside drainage bags below the level of the kidney so that your urine drains  freely.  When connecting your nephrostomy tube to a drainage bag, make sure that there are no kinks in the tubing and that your urine is draining freely. You may want to use an elastic bandage to wrap any exposed tubing that goes from the nephrostomy tube to any of the connecting tubes.  At night, you may want to connect your nephrostomy tube or the leg bag to a larger bedside drainage bag.  Follow instructions from your health care provider about how to empty or change the drainage bag.  Empty the drainage bag when it becomes ? full.  Replace the drainage bag and any extension tubing that is connected to your nephrostomy tube every 3 weeks or as often as told by your health care provider. Your health care provider will explain how to change the drainage bag and extension tubing. General instructions  Take over-the-counter and prescription medicines only as told by your health care provider.  Keep all follow-up visits as told by your health care provider. This is important. Contact a health care provider if:  You have problems with any of the valves or tubing.  You have persistent pain or soreness in your back.  You have more redness, swelling, or pain around your tube insertion site.  You have more fluid or blood coming from your tube insertion site.  Your tube insertion site feels warm to the touch.  You have pus or a bad smell coming from your tube insertion site.  You have increased urine output or you feel   burning when urinating. Get help right away if:  You have pain in your abdomen during the first week.  You have chest pain or have trouble breathing.  You have a new appearance of blood in your urine.  You have a fever or chills.  You have back pain that is not relieved by your medicine.  You have decreased urine output.  Your nephrostomy tube comes out. This information is not intended to replace advice given to you by your health care provider. Make sure you  discuss any questions you have with your health care provider. Document Revised: 07/29/2017 Document Reviewed: 05/28/2016 Elsevier Patient Education  2020 [employer] Moderate Conscious Sedation, Adult, Care After These instructions provide you with information about caring for yourself after your procedure. Your health care provider may also give you more specific instructions. Your treatment has been planned according to current medical practices, but problems sometimes occur. Call your health care provider if you have any problems or questions after your procedure. What can I expect after the procedure? After your procedure, it is common:  To feel sleepy for several hours.  To feel clumsy and have poor balance for several hours.  To have poor judgment for several hours.  To vomit if you eat too soon. Follow these instructions at home: For at least 24 hours after the procedure:   Do not: ? Participate in activities where you could fall or become injured. ? Drive. ? Use heavy machinery. ? Drink alcohol. ? Take sleeping pills or medicines that cause drowsiness. ? Make important decisions or sign legal documents. ? Take care of children on your own.  Rest. Eating and drinking  Follow the diet recommended by your health care provider.  If you vomit: ? Drink water, juice, or soup when you can drink without vomiting. ? Make sure you have little or no nausea before eating solid foods. General instructions  Have a responsible adult stay with you until you are awake and alert.  Take over-the-counter and prescription medicines only as told by your health care provider.  If you smoke, do not smoke without supervision.  Keep all follow-up visits as told by your health care provider. This is important. Contact a health care provider if:  You keep feeling nauseous or you keep vomiting.  You feel light-headed.  You develop a rash.  You have a fever. Get help right away  if:  You have trouble breathing. This information is not intended to replace advice given to you by your health care provider. Make sure you discuss any questions you have with your health care provider. Document Revised: 07/29/2017 Document Reviewed: 12/06/2015 Elsevier Patient Education  2020 [employer].

## 2020-08-15 NOTE — Procedures (Signed)
Interventional Radiology Procedure Note  Procedure: Left percutaneous nephroureteral tube access  Complications: None  Estimated Blood Loss: None  Recommendations: - DC home - PCNL as planned on 12/20   Signed,  Sterling Big, MD

## 2020-08-16 ENCOUNTER — Emergency Department (HOSPITAL_COMMUNITY): Payer: Medicare HMO

## 2020-08-16 ENCOUNTER — Other Ambulatory Visit: Payer: Self-pay

## 2020-08-16 ENCOUNTER — Encounter (HOSPITAL_COMMUNITY): Payer: Self-pay | Admitting: Emergency Medicine

## 2020-08-16 ENCOUNTER — Emergency Department (HOSPITAL_COMMUNITY)
Admission: EM | Admit: 2020-08-16 | Discharge: 2020-08-16 | Disposition: A | Discharge status: Home / Self Care | Payer: Medicare HMO | Attending: Emergency Medicine | Admitting: Emergency Medicine

## 2020-08-16 DIAGNOSIS — E876 Hypokalemia: Secondary | ICD-10-CM

## 2020-08-16 DIAGNOSIS — S199XXA Unspecified injury of neck, initial encounter: Secondary | ICD-10-CM | POA: Diagnosis not present

## 2020-08-16 DIAGNOSIS — R55 Syncope and collapse: Secondary | ICD-10-CM

## 2020-08-16 DIAGNOSIS — Z79899 Other long term (current) drug therapy: Secondary | ICD-10-CM | POA: Insufficient documentation

## 2020-08-16 DIAGNOSIS — I10 Essential (primary) hypertension: Secondary | ICD-10-CM | POA: Diagnosis not present

## 2020-08-16 DIAGNOSIS — S0990XA Unspecified injury of head, initial encounter: Secondary | ICD-10-CM | POA: Diagnosis not present

## 2020-08-16 LAB — CBC
HCT: 35.8 % — ABNORMAL LOW (ref 36.0–46.0)
Hemoglobin: 11.7 g/dL — ABNORMAL LOW (ref 12.0–15.0)
MCH: 30.5 pg (ref 26.0–34.0)
MCHC: 32.7 g/dL (ref 30.0–36.0)
MCV: 93.5 fL (ref 80.0–100.0)
Platelets: 212 10*3/uL (ref 150–400)
RBC: 3.83 MIL/uL — ABNORMAL LOW (ref 3.87–5.11)
RDW: 12.1 % (ref 11.5–15.5)
WBC: 9.4 10*3/uL (ref 4.0–10.5)
nRBC: 0 % (ref 0.0–0.2)

## 2020-08-16 LAB — BASIC METABOLIC PANEL
Anion gap: 10 (ref 5–15)
BUN: 16 mg/dL (ref 8–23)
CO2: 30 mmol/L (ref 22–32)
Calcium: 9.7 mg/dL (ref 8.9–10.3)
Chloride: 95 mmol/L — ABNORMAL LOW (ref 98–111)
Creatinine, Ser: 1.02 mg/dL — ABNORMAL HIGH (ref 0.44–1.00)
GFR, Estimated: 55 mL/min — ABNORMAL LOW (ref >60)
Glucose, Bld: 122 mg/dL — ABNORMAL HIGH (ref 70–99)
Potassium: 2.9 mmol/L — ABNORMAL LOW (ref 3.5–5.1)
Sodium: 135 mmol/L (ref 135–145)

## 2020-08-16 MED ORDER — POTASSIUM CHLORIDE CRYS ER 20 MEQ PO TBCR: 20.0000 meq | EXTENDED_RELEASE_TABLET | Freq: Every day | ORAL | 0 refills | Status: DC

## 2020-08-16 MED ORDER — ACETAMINOPHEN 325 MG PO TABS
650.0000 mg | ORAL_TABLET | Freq: Once | ORAL | Status: AC
  Administered 2020-08-16: 12:00:00 650 mg via ORAL
  Filled 2020-08-16: qty 2

## 2020-08-16 MED ORDER — POTASSIUM CHLORIDE 10 MEQ/100ML IV SOLN
10.0000 meq | Freq: Once | INTRAVENOUS | Status: AC
  Administered 2020-08-16: 11:00:00 10 meq via INTRAVENOUS
  Filled 2020-08-16: qty 100

## 2020-08-16 NOTE — Discharge Instructions (Addendum)
Follow-up for diet low potassium with your primary care doctor and follow-up with urology for your nephrostomy

## 2020-08-16 NOTE — ED Notes (Signed)
Family at bedside. 

## 2020-08-16 NOTE — ED Triage Notes (Signed)
Pt states she left from having a procedure, went home, felt dizziness and fell. Pt hit her head. Been off her blood thinners for over a week during prep for surgery

## 2020-08-16 NOTE — ED Notes (Signed)
Dr Rubin Payor in to speak with family.

## 2020-08-16 NOTE — ED Notes (Signed)
C/o pain to left lateral side rating pain 7/10.

## 2020-08-16 NOTE — ED Provider Notes (Signed)
[facility] EMERGENCY DEPARTMENT Provider Note   CSN: [id number] Arrival date & time: 08/16/20  6606     History Chief Complaint  Patient presents with  . Fall    Claire Barnett is a 84 y.o. female.  HPI Patient presents after a near syncope/fall yesterday.  Had a percutaneous nephro lithotomy done by interventional radiology yesterday with plan of more procedure soon.  States she went home and then felt lightheaded and fell.  Struck the back of her head.  No loss of consciousness but states she did feel little dizzy.  States she came in today because her neck was feeling tighter and that the back of her head was more painful.  Has not felt dizzy since.  Not on anticoagulation although Xarelto appears in her medication list.  No confusion.  Feels better today.    Past Medical History:  Diagnosis Date  . Anxiety   . History of kidney stones   . Hypertension   . S/P colonoscopy August 2007   Rare sigmoid diverticulosis.  Otherwise, no polyps, inflammatory    Patient Active Problem List   Diagnosis Date Noted  . UTI (lower urinary tract infection) 09/07/2013  . Encephalopathy acute 09/07/2013  . Influenza 09/07/2013  . HTN (hypertension) 09/07/2013  . Anxiety 09/07/2013  . Family hx of colon cancer 04/15/2011    Past Surgical History:  Procedure Laterality Date  . ABDOMINAL HYSTERECTOMY    . BREAST LUMPECTOMY Right    benign  . CATARACT EXTRACTION W/PHACO Right 02/01/2013   Procedure: CATARACT EXTRACTION PHACO AND INTRAOCULAR LENS PLACEMENT (IOC);  Surgeon: Gemma Payor, MD;  Location: [facility] ORS;  Service: Ophthalmology;  Laterality: Right;  CDE:25.80  . COLONOSCOPY  05/12/2011   Procedure: COLONOSCOPY;  Surgeon: Arlyce Harman, MD;  Location: [facility] ENDO SUITE;  Service: Endoscopy;  Laterality: N/A;  10:30AM  . CYSTOSCOPY W/ STONE MANIPULATION     x3  . IR NEPHROSTOMY PLACEMENT LEFT  08/15/2020  . kidney stones     X 4 procedures     OB History    Gravida  1   Para       Term      Preterm      AB  1   Living        SAB  1   IAB      Ectopic      Multiple      Live Births              Family History  Problem Relation Age of Onset  . Colon cancer Sister        diagnosed late 31s.   . Colon cancer Mother        diagnosed in mid to late 39s. Deceased age 57    Social History   Tobacco Use  . Smoking status: Never Smoker  . Smokeless tobacco: Never Used  Vaping Use  . Vaping Use: Never used  Substance Use Topics  . Alcohol use: No  . Drug use: No    Home Medications Prior to Admission medications   Medication Sig Start Date End Date Taking? Authorizing Provider  ALPRAZolam Prudy Feeler) 0.5 MG tablet Take 0.5-1 mg by mouth 3 (three) times daily. 01/10/20   [provider]  cholecalciferol (VITAMIN D) 1000 UNITS tablet Take 1,000 Units by mouth daily.    [provider]  gabapentin (NEURONTIN) 100 MG capsule Take 100 mg by mouth 3 (three) times daily. 01/07/20   [provider]  hydrochlorothiazide 25 MG tablet Take 25 mg by mouth every morning. 04/12/11   [provider]  meclizine (ANTIVERT) 25 MG tablet Take 25 mg by mouth every 6 (six) hours as needed for dizziness. 07/01/20   [provider]  ondansetron (ZOFRAN) 4 MG tablet Take 4-8 mg by mouth every 6 (six) hours as needed for nausea or vomiting. 07/01/20   [provider]  potassium chloride SA (KLOR-CON) 20 MEQ tablet Take 1 tablet (20 mEq total) by mouth daily. 08/16/20   Benjiman CorePickering, Jamayah Myszka, MD  sertraline (ZOLOFT) 25 MG tablet Take 25 mg by mouth daily. 12/18/19   [provider]  traMADol (ULTRAM) 50 MG tablet Take 1 tablet (50 mg total) by mouth every 6 (six) hours as needed. 07/16/20   McKenzie, Mardene CelestePatrick L, MD  XARELTO 20 MG TABS tablet Take 20 mg by mouth every evening. 06/02/20   [provider]    Allergies    Patient has no known allergies.  Review of Systems   Review of Systems  Constitutional:  Negative for appetite change.  HENT: Negative for congestion.   Respiratory: Negative for shortness of breath.   Gastrointestinal: Negative for abdominal pain.  Genitourinary: Positive for flank pain.  Musculoskeletal: Positive for neck pain. Negative for back pain.  Skin: Negative for wound.  Neurological: Positive for light-headedness and headaches.  Psychiatric/Behavioral: Negative for confusion.    Physical Exam Updated Vital Signs BP (!) 157/76   Pulse 88   Temp 98.4 F (36.9 C) (Oral)   Resp 13   Ht 5' (1.524 m)   Wt 59 kg   SpO2 96%   BMI 25.39 kg/m   Physical Exam Vitals and nursing note reviewed.  HENT:     Head: Atraumatic.     Mouth/Throat:     Mouth: Mucous membranes are moist.  Eyes:     Pupils: Pupils are equal, round, and reactive to light.  Neck:     Comments: No midline tenderness. Cardiovascular:     Rate and Rhythm: Regular rhythm.  Abdominal:     Tenderness: There is no abdominal tenderness.  Genitourinary:    Comments: Percutaneous drain in place in left posterior flank.  Mild amount of blood on dressing. Musculoskeletal:     Cervical back: Neck supple.     Comments: Mild tenderness to occipital area.  Skin:    General: Skin is warm.     Capillary Refill: Capillary refill takes less than 2 seconds.  Neurological:     Mental Status: She is alert and oriented to person, place, and time.     ED Results / Procedures / Treatments   Labs (all labs ordered are listed, but only abnormal results are displayed) Labs Reviewed  BASIC METABOLIC PANEL - Abnormal; Notable for the following components:      Result Value   Potassium 2.9 (*)    Chloride 95 (*)    Glucose, Bld 122 (*)    Creatinine, Ser 1.02 (*)    GFR, Estimated 55 (*)    All other components within normal limits  CBC - Abnormal; Notable for the following components:   RBC 3.83 (*)    Hemoglobin 11.7 (*)    HCT 35.8 (*)    All other components within normal limits    EKG EKG  Interpretation  Date/Time:  Saturday August 16 2020 09:41:20 EST Ventricular Rate:  81 PR Interval:    QRS Duration: 91 QT Interval:  455 QTC Calculation: 529  R Axis:   19 Text Interpretation: Sinus rhythm Nonspecific T abnormalities, diffuse leads Prolonged QT interval No significant change since last tracing Confirmed by Benjiman Core [phone]) on 08/16/2020 10:57:45 AM   Radiology CT Head Wo Contrast  Result Date: 08/16/2020 CLINICAL DATA:  Dizziness.  Head injury. EXAM: CT HEAD WITHOUT CONTRAST CT CERVICAL SPINE WITHOUT CONTRAST TECHNIQUE: Multidetector CT imaging of the head and cervical spine was performed following the standard protocol without intravenous contrast. Multiplanar CT image reconstructions of the cervical spine were also generated. COMPARISON:  None. FINDINGS: CT HEAD FINDINGS Brain: No evidence of acute infarction, hemorrhage, hydrocephalus, extra-axial collection or mass lesion/mass effect. Patchy white matter hypoattenuation, most likely related to chronic microvascular ischemic disease. Mild generalized volume loss. Vascular: Calcific atherosclerosis. Skull: No acute fracture. Sinuses/Orbits: Evidence of chronic right maxillary sinusitis with mucosal thickening and maxillary sinus wall thickening. Other: No mastoid effusions. CT CERVICAL SPINE FINDINGS Alignment: No substantial subluxation. Skull base and vertebrae: No evidence of acute fracture. Vertebral body heights are maintained. Soft tissues and spinal canal: No prevertebral fluid or swelling. No visible canal hematoma. Disc levels: Moderate multilevel degenerative change. Degenerative cystic change of the dens. 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 Upper chest: No acute findings in the lung apices. Other: Bilateral thyroid masses, previously evaluated by ultrasound on August 01, 2012. IMPRESSION: CT head: 1. No evidence of acute intracranial abnormality. 2. Evidence of  chronic right maxillary sinusitis. CT cervical spine: 1. No evidence of acute fracture or traumatic malalignment. 2. 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 MRI could better evaluate the canal and cord if clinically indicated. 3. Bilateral thyroid masses, previously evaluated by ultrasound on August 01, 2012. Electronically Signed   By: Feliberto Harts MD   On: 08/16/2020 10:49   CT Cervical Spine Wo Contrast  Result Date: 08/16/2020 CLINICAL DATA:  Dizziness.  Head injury. EXAM: CT HEAD WITHOUT CONTRAST CT CERVICAL SPINE WITHOUT CONTRAST TECHNIQUE: Multidetector CT imaging of the head and cervical spine was performed following the standard protocol without intravenous contrast. Multiplanar CT image reconstructions of the cervical spine were also generated. COMPARISON:  None. FINDINGS: CT HEAD FINDINGS Brain: No evidence of acute infarction, hemorrhage, hydrocephalus, extra-axial collection or mass lesion/mass effect. Patchy white matter hypoattenuation, most likely related to chronic microvascular ischemic disease. Mild generalized volume loss. Vascular: Calcific atherosclerosis. Skull: No acute fracture. Sinuses/Orbits: Evidence of chronic right maxillary sinusitis with mucosal thickening and maxillary sinus wall thickening. Other: No mastoid effusions. CT CERVICAL SPINE FINDINGS Alignment: No substantial subluxation. Skull base and vertebrae: No evidence of acute fracture. Vertebral body heights are maintained. Soft tissues and spinal canal: No prevertebral fluid or swelling. No visible canal hematoma. Disc levels: Moderate multilevel degenerative change. Degenerative cystic change of the dens. 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 Upper chest: No acute findings in the lung apices. Other: Bilateral thyroid masses, previously evaluated by ultrasound on August 01, 2012. IMPRESSION: CT head: 1. No evidence of acute  intracranial abnormality. 2. Evidence of chronic right maxillary sinusitis. CT cervical spine: 1. No evidence of acute fracture or traumatic malalignment. 2. 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 MRI could better evaluate the canal and cord if clinically indicated. 3. Bilateral thyroid masses, previously evaluated by ultrasound on August 01, 2012. Electronically Signed   By: Feliberto Harts MD   On: 08/16/2020 10:49  IR NEPHROSTOMY PLACEMENT LEFT  Result Date: 08/15/2020 INDICATION: Renal stones, access for left percutaneous nephrolithotomy. EXAM: 1. Percutaneous puncture of the renal collecting system under fluoroscopic guidance 2. Placement of a percutaneous nephroureteral catheter Operating Physician:  Sterling Big, MD COMPARISON:  CT of the abdomen and pelvis-07/16/2020 MEDICATIONS: 400 mg Cipro; The antibiotic was administered in an appropriate time frame prior to skin puncture. ANESTHESIA/SEDATION: Fentanyl 50 mcg IV; Versed 1 mg IV Moderate Sedation Time:  21 minutes The patient was continuously monitored during the procedure by the interventional radiology nurse under my direct supervision. CONTRAST:  70mL OMNIPAQUE IOHEXOL 300 MG/ML SOLN - administered into the collecting system(s) FLUOROSCOPY TIME:  Fluoroscopy Time: 6 minutes 18 seconds (40 mGy). COMPLICATIONS: None immediate. PROCEDURE: Informed written consent was obtained from the patient after a thorough discussion of the procedural risks, benefits and alternatives. All questions were addressed. Maximal Sterile Barrier Technique was utilized including caps, mask, sterile gowns, sterile gloves, sterile drape, hand hygiene and skin antiseptic. A timeout was performed prior to the initiation of the procedure. A pre procedural spot fluoroscopic image was obtained of the upper abdomen. Ultrasound scanning performed of the kidney was negative for significant hydronephrosis. As such, the large  staghorn calculus within the renal pelvis was targeted fluoroscopically with a 22 gauge Chiba needle. Access to the collecting system was confirmed with advancement of a Nitrex wire into the collecting system. The needle was exchanged for the inner 3 French catheter from an Accustick set and contrast injection confirmed access. A small amount of air was injected into the collecting system to help delineate a posterior calyx. A posterior inferior calyx was targeted with a 22 gauge Chiba needle. Access to the calyx was confirmed with advancement of a Nitrex wire into the collecting system. An Accustick set was utilized to dilate the tract and was subsequently exchanged for a Kumpe catheter over a Bentson wire. The Kumpe catheter was advanced down the ureter and into the urinary bladder. Postprocedural spot radiographs were obtained in various obliquities and the catheter was sutured to the skin. The catheter was capped and a dressing was placed. The patient tolerated the procedure well without immediate postprocedural complication. IMPRESSION: Successful fluoroscopic guided left percutaneous nephrostomy with placement of a 5 French Kumpe catheter to the level of the urinary bladder to be utilized during impending nephrolithotomy procedure. Electronically Signed   By: Malachy Moan M.D.   On: 08/15/2020 16:18    Procedures Procedures (including critical care time)  Medications Ordered in ED Medications  potassium chloride 10 mEq in 100 mL IVPB (0 mEq Intravenous Stopped 08/16/20 1213)  acetaminophen (TYLENOL) tablet 650 mg (650 mg Oral Given 08/16/20 1215)    ED Course  I have reviewed the triage vital signs and the nursing notes.  Pertinent labs & imaging results that were available during my care of the patient were reviewed by me and considered in my medical decision making (see chart for details).    MDM Rules/Calculators/A&P                          Patient with fall yesterday after  procedure during the day.  I think most likely was caused from not eating and drinking as much that day.  Work-up today reassuring.  Hemoglobin stable.  Does have mild hypokalemia however.  Was supplemented.  Patient feels better.  Discharge home.  Follow-up with PCP and urology.  Doubt severe either injury from the fall  or complication/bleeding from the nephrostomy.  I think patient stable for discharge home.  Have reviewed labs and imaging. Final Clinical Impression(s) / ED Diagnoses Final diagnoses:  Near syncope  Hypokalemia    Rx / DC Orders ED Discharge Orders         Ordered    potassium chloride SA (KLOR-CON) 20 MEQ tablet  Daily        08/16/20 1242           Benjiman Core, MD 08/16/20 1544

## 2020-08-16 NOTE — ED Notes (Signed)
Patient transported to CT 

## 2020-08-18 ENCOUNTER — Ambulatory Visit (HOSPITAL_COMMUNITY): Payer: Medicare HMO | Admitting: Anesthesiology

## 2020-08-18 ENCOUNTER — Other Ambulatory Visit: Payer: Self-pay

## 2020-08-18 ENCOUNTER — Encounter (HOSPITAL_COMMUNITY)
Admission: RE | Disposition: A | Discharge status: Home / Self Care | Payer: Self-pay | Source: Home / Self Care | Attending: Urology

## 2020-08-18 ENCOUNTER — Encounter (HOSPITAL_COMMUNITY): Payer: Self-pay | Admitting: Urology

## 2020-08-18 ENCOUNTER — Observation Stay (HOSPITAL_COMMUNITY)
Admission: RE | Admit: 2020-08-18 | Discharge: 2020-08-19 | Disposition: A | Discharge status: Home / Self Care | Payer: Medicare HMO | Attending: Urology | Admitting: Urology

## 2020-08-18 ENCOUNTER — Ambulatory Visit (HOSPITAL_COMMUNITY): Payer: Medicare HMO

## 2020-08-18 DIAGNOSIS — N200 Calculus of kidney: Principal | ICD-10-CM

## 2020-08-18 DIAGNOSIS — F419 Anxiety disorder, unspecified: Secondary | ICD-10-CM | POA: Diagnosis not present

## 2020-08-18 DIAGNOSIS — Z87442 Personal history of urinary calculi: Secondary | ICD-10-CM | POA: Insufficient documentation

## 2020-08-18 DIAGNOSIS — I10 Essential (primary) hypertension: Secondary | ICD-10-CM | POA: Diagnosis not present

## 2020-08-18 HISTORY — PX: NEPHROLITHOTOMY: SHX5134

## 2020-08-18 LAB — BASIC METABOLIC PANEL
Anion gap: 8 (ref 5–15)
BUN: 12 mg/dL (ref 8–23)
CO2: 29 mmol/L (ref 22–32)
Calcium: 9.1 mg/dL (ref 8.9–10.3)
Chloride: 100 mmol/L (ref 98–111)
Creatinine, Ser: 0.92 mg/dL (ref 0.44–1.00)
GFR, Estimated: >60 mL/min (ref >60)
Glucose, Bld: 136 mg/dL — ABNORMAL HIGH (ref 70–99)
Potassium: 3.5 mmol/L (ref 3.5–5.1)
Sodium: 137 mmol/L (ref 135–145)

## 2020-08-18 LAB — CBC
HCT: 33.6 % — ABNORMAL LOW (ref 36.0–46.0)
Hemoglobin: 10.9 g/dL — ABNORMAL LOW (ref 12.0–15.0)
MCH: 30.8 pg (ref 26.0–34.0)
MCHC: 32.4 g/dL (ref 30.0–36.0)
MCV: 94.9 fL (ref 80.0–100.0)
Platelets: 186 10*3/uL (ref 150–400)
RBC: 3.54 MIL/uL — ABNORMAL LOW (ref 3.87–5.11)
RDW: 12.1 % (ref 11.5–15.5)
WBC: 10 10*3/uL (ref 4.0–10.5)
nRBC: 0 % (ref 0.0–0.2)

## 2020-08-18 LAB — POCT I-STAT, CHEM 8
BUN: 11 mg/dL (ref 8–23)
Calcium, Ion: 1.26 mmol/L (ref 1.15–1.40)
Chloride: 97 mmol/L — ABNORMAL LOW (ref 98–111)
Creatinine, Ser: 0.9 mg/dL (ref 0.44–1.00)
Glucose, Bld: 102 mg/dL — ABNORMAL HIGH (ref 70–99)
HCT: 34 % — ABNORMAL LOW (ref 36.0–46.0)
Hemoglobin: 11.6 g/dL — ABNORMAL LOW (ref 12.0–15.0)
Potassium: 3.4 mmol/L — ABNORMAL LOW (ref 3.5–5.1)
Sodium: 139 mmol/L (ref 135–145)
TCO2: 29 mmol/L (ref 22–32)

## 2020-08-18 SURGERY — NEPHROLITHOTOMY PERCUTANEOUS
Anesthesia: General | Site: Flank | Laterality: Left

## 2020-08-18 MED ORDER — SODIUM CHLORIDE 0.9 % IV SOLN
2.0000 g | INTRAVENOUS | Status: AC
  Administered 2020-08-18: 13:00:00 100 g via INTRAVENOUS
  Administered 2020-08-18: 11:00:00 2 g via INTRAVENOUS

## 2020-08-18 MED ORDER — ZOLPIDEM TARTRATE 5 MG PO TABS: 5.0000 mg | ORAL_TABLET | Freq: Every evening | ORAL | Status: DC | PRN

## 2020-08-18 MED ORDER — DIPHENHYDRAMINE HCL 12.5 MG/5ML PO ELIX: 12.5000 mg | ORAL_SOLUTION | Freq: Four times a day (QID) | ORAL | Status: DC | PRN

## 2020-08-18 MED ORDER — DEXAMETHASONE SODIUM PHOSPHATE 10 MG/ML IJ SOLN
INTRAMUSCULAR | Status: DC | PRN
  Administered 2020-08-18: 5 mg via INTRAVENOUS

## 2020-08-18 MED ORDER — ROCURONIUM BROMIDE 10 MG/ML (PF) SYRINGE
PREFILLED_SYRINGE | INTRAVENOUS | Status: DC | PRN
  Administered 2020-08-18: 40 mg via INTRAVENOUS

## 2020-08-18 MED ORDER — ONDANSETRON HCL 4 MG/2ML IJ SOLN: 4.0000 mg | INTRAMUSCULAR | Status: DC | PRN

## 2020-08-18 MED ORDER — DIPHENHYDRAMINE HCL 50 MG/ML IJ SOLN: 12.5000 mg | Freq: Four times a day (QID) | INTRAMUSCULAR | Status: DC | PRN

## 2020-08-18 MED ORDER — HYDROMORPHONE HCL 1 MG/ML IJ SOLN
0.2500 mg | INTRAMUSCULAR | Status: DC | PRN
  Administered 2020-08-18: 0.5 mg via INTRAVENOUS
  Filled 2020-08-18: qty 0.5

## 2020-08-18 MED ORDER — CHLORHEXIDINE GLUCONATE 0.12 % MT SOLN
OROMUCOSAL | Status: AC
  Filled 2020-08-18: qty 15

## 2020-08-18 MED ORDER — ONDANSETRON HCL 4 MG/2ML IJ SOLN
INTRAMUSCULAR | Status: DC | PRN
  Administered 2020-08-18: 4 mg via INTRAVENOUS

## 2020-08-18 MED ORDER — ROCURONIUM BROMIDE 10 MG/ML (PF) SYRINGE
PREFILLED_SYRINGE | INTRAVENOUS | Status: AC
  Filled 2020-08-18: qty 10

## 2020-08-18 MED ORDER — SUCCINYLCHOLINE CHLORIDE 200 MG/10ML IV SOSY
PREFILLED_SYRINGE | INTRAVENOUS | Status: DC | PRN
  Administered 2020-08-18: 100 mg via INTRAVENOUS

## 2020-08-18 MED ORDER — SODIUM CHLORIDE 0.9 % IV SOLN: INTRAVENOUS | Status: DC

## 2020-08-18 MED ORDER — IOPAMIDOL (ISOVUE-300) INJECTION 61%
INTRAVENOUS | Status: DC | PRN
  Administered 2020-08-18: 12:00:00 20 mL

## 2020-08-18 MED ORDER — SODIUM CHLORIDE 0.9 % IV SOLN
INTRAVENOUS | Status: AC
  Filled 2020-08-18: qty 20

## 2020-08-18 MED ORDER — ONDANSETRON HCL 4 MG/2ML IJ SOLN
INTRAMUSCULAR | Status: AC
  Filled 2020-08-18: qty 2

## 2020-08-18 MED ORDER — FENTANYL CITRATE (PF) 250 MCG/5ML IJ SOLN
INTRAMUSCULAR | Status: AC
  Filled 2020-08-18: qty 5

## 2020-08-18 MED ORDER — LACTATED RINGERS IV SOLN: Freq: Once | INTRAVENOUS | Status: AC

## 2020-08-18 MED ORDER — CHLORHEXIDINE GLUCONATE 0.12 % MT SOLN
15.0000 mL | Freq: Once | OROMUCOSAL | Status: AC
  Administered 2020-08-18: 09:00:00 15 mL via OROMUCOSAL

## 2020-08-18 MED ORDER — ORAL CARE MOUTH RINSE: 15.0000 mL | Freq: Once | OROMUCOSAL | Status: AC

## 2020-08-18 MED ORDER — GABAPENTIN 100 MG PO CAPS
100.0000 mg | ORAL_CAPSULE | Freq: Three times a day (TID) | ORAL | Status: DC
Start: 2020-08-18 — End: 2020-08-19
  Administered 2020-08-18 – 2020-08-19 (×4): 100 mg via ORAL
  Filled 2020-08-18 (×4): qty 1

## 2020-08-18 MED ORDER — FENTANYL CITRATE (PF) 100 MCG/2ML IJ SOLN
INTRAMUSCULAR | Status: DC | PRN
  Administered 2020-08-18 (×4): 50 ug via INTRAVENOUS
  Administered 2020-08-18: 25 ug via INTRAVENOUS

## 2020-08-18 MED ORDER — ACETAMINOPHEN 325 MG PO TABS: 650.0000 mg | ORAL_TABLET | ORAL | Status: DC | PRN

## 2020-08-18 MED ORDER — SUGAMMADEX SODIUM 500 MG/5ML IV SOLN
INTRAVENOUS | Status: DC | PRN
  Administered 2020-08-18: 150 mg via INTRAVENOUS

## 2020-08-18 MED ORDER — DEXAMETHASONE SODIUM PHOSPHATE 10 MG/ML IJ SOLN
INTRAMUSCULAR | Status: AC
  Filled 2020-08-18: qty 1

## 2020-08-18 MED ORDER — SUCCINYLCHOLINE CHLORIDE 200 MG/10ML IV SOSY
PREFILLED_SYRINGE | INTRAVENOUS | Status: AC
  Filled 2020-08-18: qty 10

## 2020-08-18 MED ORDER — LIDOCAINE HCL (PF) 2 % IJ SOLN
INTRAMUSCULAR | Status: AC
  Filled 2020-08-18: qty 5

## 2020-08-18 MED ORDER — ALPRAZOLAM 0.5 MG PO TABS
0.5000 mg | ORAL_TABLET | Freq: Three times a day (TID) | ORAL | Status: DC
  Administered 2020-08-18 – 2020-08-19 (×4): 0.5 mg via ORAL
  Filled 2020-08-18 (×4): qty 1

## 2020-08-18 MED ORDER — LACTATED RINGERS IV SOLN: INTRAVENOUS | Status: DC | PRN

## 2020-08-18 MED ORDER — DIATRIZOATE MEGLUMINE 30 % UR SOLN
URETHRAL | Status: AC
  Filled 2020-08-18: qty 100

## 2020-08-18 MED ORDER — SODIUM CHLORIDE 0.9 % IR SOLN
Status: DC | PRN
  Administered 2020-08-18 (×3): 6000 mL

## 2020-08-18 MED ORDER — PROPOFOL 10 MG/ML IV BOLUS
INTRAVENOUS | Status: DC | PRN
  Administered 2020-08-18: 80 mg via INTRAVENOUS

## 2020-08-18 MED ORDER — SERTRALINE HCL 50 MG PO TABS
25.0000 mg | ORAL_TABLET | Freq: Every day | ORAL | Status: DC
Start: 2020-08-18 — End: 2020-08-19
  Administered 2020-08-18 – 2020-08-19 (×2): 25 mg via ORAL
  Filled 2020-08-18 (×2): qty 1

## 2020-08-18 MED ORDER — HYDROMORPHONE HCL 1 MG/ML IJ SOLN: 0.5000 mg | INTRAMUSCULAR | Status: DC | PRN

## 2020-08-18 MED ORDER — LIDOCAINE HCL (CARDIAC) PF 100 MG/5ML IV SOSY
PREFILLED_SYRINGE | INTRAVENOUS | Status: DC | PRN
  Administered 2020-08-18: 50 mg via INTRATRACHEAL

## 2020-08-18 MED ORDER — HYDROCHLOROTHIAZIDE 25 MG PO TABS
25.0000 mg | ORAL_TABLET | Freq: Every morning | ORAL | Status: DC
  Administered 2020-08-18 – 2020-08-19 (×2): 25 mg via ORAL
  Filled 2020-08-18 (×2): qty 1

## 2020-08-18 MED ORDER — ONDANSETRON HCL 4 MG/2ML IJ SOLN: 4.0000 mg | Freq: Once | INTRAMUSCULAR | Status: DC | PRN

## 2020-08-18 MED ORDER — OXYCODONE-ACETAMINOPHEN 5-325 MG PO TABS
1.0000 | ORAL_TABLET | ORAL | Status: DC | PRN
  Administered 2020-08-18 – 2020-08-19 (×4): 1 via ORAL
  Filled 2020-08-18 (×4): qty 1

## 2020-08-18 SURGICAL SUPPLY — 67 items
APL PRP STRL LF DISP 70% ISPRP (MISCELLANEOUS) ×2
APL SKNCLS STERI-STRIP NONHPOA (GAUZE/BANDAGES/DRESSINGS) ×2
BAG DRN RND TRDRP ANRFLXCHMBR (UROLOGICAL SUPPLIES) ×2
BAG URINE DRAIN 2000ML AR STRL (UROLOGICAL SUPPLIES) ×4 IMPLANT
BASKET ZERO TIP NITINOL 2.4FR (BASKET) IMPLANT
BENZOIN TINCTURE PRP APPL 2/3 (GAUZE/BANDAGES/DRESSINGS) ×4 IMPLANT
BLADE SURG 15 STRL LF DISP TIS (BLADE) ×2 IMPLANT
BLADE SURG 15 STRL SS (BLADE) ×4
BSKT STON RTRVL ZERO TP 2.4FR (BASKET)
CATH FOLEY 2W COUNCIL 20FR 5CC (CATHETERS) IMPLANT
CATH FOLEY 2WAY SLVR  5CC 16FR (CATHETERS) ×4
CATH FOLEY 2WAY SLVR  5CC 18FR (CATHETERS)
CATH FOLEY 2WAY SLVR 5CC 16FR (CATHETERS) ×2 IMPLANT
CATH FOLEY 2WAY SLVR 5CC 18FR (CATHETERS) IMPLANT
CATH INTERMIT  6FR 70CM (CATHETERS) ×4 IMPLANT
CATH ROBINSON RED A/P 20FR (CATHETERS) ×4 IMPLANT
CATH X-FORCE N30 NEPHROSTOMY (TUBING) ×4 IMPLANT
CHLORAPREP W/TINT 26 (MISCELLANEOUS) ×4 IMPLANT
COVER LIGHT HANDLE STERIS (MISCELLANEOUS) ×4 IMPLANT
COVER WAND RF STERILE (DRAPES) ×4 IMPLANT
DRAPE C-ARM FOLDED MOBILE STRL (DRAPES) ×4 IMPLANT
DRAPE HALF SHEET 40X57 (DRAPES) ×4 IMPLANT
DRAPE LINGEMAN PERC (DRAPES) ×4 IMPLANT
DRAPE SURG IRRIG POUCH 19X23 (DRAPES) ×4 IMPLANT
DRSG PAD ABDOMINAL 8X10 ST (GAUZE/BANDAGES/DRESSINGS) ×8 IMPLANT
DRSG TEGADERM 4X10 (GAUZE/BANDAGES/DRESSINGS) ×8 IMPLANT
FIBER LASER FLEXIVA 365 (UROLOGICAL SUPPLIES) IMPLANT
FIBER LASER FLEXIVA 550 (UROLOGICAL SUPPLIES) IMPLANT
FIBER LASER TRAC TIP (UROLOGICAL SUPPLIES) IMPLANT
GAUZE 4X4 16PLY RFD (DISPOSABLE) ×4 IMPLANT
GAUZE SPONGE 4X4 12PLY STRL (GAUZE/BANDAGES/DRESSINGS) ×4 IMPLANT
GLOVE BIO SURGEON STRL SZ8 (GLOVE) ×4 IMPLANT
GLOVE BIOGEL PI IND STRL 7.0 (GLOVE) ×4 IMPLANT
GLOVE BIOGEL PI IND STRL 8 (GLOVE) ×2 IMPLANT
GLOVE BIOGEL PI INDICATOR 7.0 (GLOVE) ×4
GLOVE BIOGEL PI INDICATOR 8 (GLOVE) ×2
GOWN STRL REUS W/TWL LRG LVL3 (GOWN DISPOSABLE) ×8 IMPLANT
GOWN STRL REUS W/TWL XL LVL3 (GOWN DISPOSABLE) ×4 IMPLANT
GUIDEWIRE STR DUAL SENSOR (WIRE) ×4 IMPLANT
IV NS IRRIG 3000ML ARTHROMATIC (IV SOLUTION) ×24 IMPLANT
KIT BLADEGUARD II DBL (SET/KITS/TRAYS/PACK) ×4 IMPLANT
KIT TURNOVER KIT A (KITS) ×4 IMPLANT
MANIFOLD NEPTUNE II (INSTRUMENTS) ×4 IMPLANT
NS IRRIG 1000ML POUR BTL (IV SOLUTION) ×4 IMPLANT
PACK CYSTO (CUSTOM PROCEDURE TRAY) ×4 IMPLANT
PAD ABD 5X9 TENDERSORB (GAUZE/BANDAGES/DRESSINGS) IMPLANT
PAD ARMBOARD 7.5X6 YLW CONV (MISCELLANEOUS) ×4 IMPLANT
POSITIONER HEAD PRONE TRACH (MISCELLANEOUS) ×4 IMPLANT
PROBE LITHOCLAST ULTRA 3.8X403 (UROLOGICAL SUPPLIES) ×4 IMPLANT
PROBE PNEUMATIC 1.0MMX570MM (UROLOGICAL SUPPLIES) IMPLANT
SET BASIN LINEN APH (SET/KITS/TRAYS/PACK) ×4 IMPLANT
SET IRRIG Y TYPE TUR BLADDER L (SET/KITS/TRAYS/PACK) ×4 IMPLANT
SHEATH COOK PEEL AWAY SET 9F (SHEATH) ×4 IMPLANT
SPONGE DRAIN TRACH 4X4 STRL 2S (GAUZE/BANDAGES/DRESSINGS) ×4 IMPLANT
SPONGE LAP 4X18 RFD (DISPOSABLE) ×4 IMPLANT
STENT URET 6FRX26 CONTOUR (STENTS) ×4 IMPLANT
STONE CATCHER W/TUBE ADAPTER (UROLOGICAL SUPPLIES) ×4 IMPLANT
SUT SILK 2 0 SH (SUTURE) ×8 IMPLANT
SYR 10ML LL (SYRINGE) ×4 IMPLANT
SYR 20ML LL LF (SYRINGE) ×8 IMPLANT
SYR 50ML LL SCALE MARK (SYRINGE) ×4 IMPLANT
TRACTIP FLEXIVA PULS ID 200XHI (Laser) IMPLANT
TRACTIP FLEXIVA PULSE ID 200 (Laser)
TRAY FOLEY W/BAG SLVR 16FR (SET/KITS/TRAYS/PACK) ×4
TRAY FOLEY W/BAG SLVR 16FR ST (SET/KITS/TRAYS/PACK) ×2 IMPLANT
WATER STERILE IRR 1000ML POUR (IV SOLUTION) ×4 IMPLANT
YANKAUER SUCT BULB TIP 10FT TU (MISCELLANEOUS) ×4 IMPLANT

## 2020-08-18 NOTE — Anesthesia Procedure Notes (Signed)
Procedure Name: Intubation Date/Time: 08/18/2020 10:41 AM Performed by: Lorin Glass, CRNA Pre-anesthesia Checklist: Patient identified, Emergency Drugs available, Suction available and Patient being monitored Patient Re-evaluated:Patient Re-evaluated prior to induction Oxygen Delivery Method: Circle system utilized Preoxygenation: Pre-oxygenation with 100% oxygen Induction Type: IV induction Ventilation: Mask ventilation without difficulty Laryngoscope Size: Glidescope and 3 Grade View: Grade I Tube type: Oral Tube size: 7.0 mm Number of attempts: 1 Airway Equipment and Method: Stylet Placement Confirmation: ETT inserted through vocal cords under direct vision,  positive ETCO2 and breath sounds checked- equal and bilateral Secured at: 21 cm Tube secured with: Tape Dental Injury: Teeth and Oropharynx as per pre-operative assessment  Comments: Head and neck maintained in neutral position throughout intubation.

## 2020-08-18 NOTE — Anesthesia Postprocedure Evaluation (Signed)
Anesthesia Post Note  Patient: Claire Barnett  Procedure(s) Performed: NEPHROLITHOTOMY PERCUTANEOUS. (Left Flank)  Patient location during evaluation: PACU Anesthesia Type: General Level of consciousness: awake and oriented Pain management: pain level controlled Vital Signs Assessment: post-procedure vital signs reviewed and stable Respiratory status: spontaneous breathing Cardiovascular status: blood pressure returned to baseline Postop Assessment: no apparent nausea or vomiting Anesthetic complications: no   No complications documented.   Last Vitals:  Vitals:   08/18/20 1300 08/18/20 1330  BP: (!) 174/82   Pulse: 82 75  Resp: 15 11  Temp:    SpO2: 100% 100%    Last Pain:  Vitals:   08/18/20 1331  TempSrc:   PainSc: 5                  Lorin Glass

## 2020-08-18 NOTE — Anesthesia Preprocedure Evaluation (Addendum)
Anesthesia Evaluation  Patient identified by MRN, date of birth, ID band Patient awake    Reviewed: Allergy & Precautions, NPO status , Patient's Chart, lab work & pertinent test results  Airway Mallampati: III  TM Distance: >3 FB Neck ROM: Limited    Dental no notable dental hx. (+) Dental Advisory Given, Missing   Pulmonary PE   Pulmonary exam normal breath sounds clear to auscultation       Cardiovascular Exercise Tolerance: Good hypertension, Pt. on medications Normal cardiovascular exam Rhythm:Regular Rate:Normal  16-Aug-2020 09:41:20 [facility] ROUTINE RECORD Sinus rhythm Nonspecific T abnormalities, diffuse leads Prolonged QT interval   Neuro/Psych Anxiety negative neurological ROS     GI/Hepatic negative GI ROS, Neg liver ROS,   Endo/Other  negative endocrine ROS  Renal/GU negative Renal ROS     Musculoskeletal Neck pain, fell on friday   Abdominal   Peds  Hematology negative hematology ROS (+)   Anesthesia Other Findings CT head:  1. No evidence of acute intracranial abnormality. 2. Evidence of chronic right maxillary sinusitis  CT cervical spine:  1. No evidence of acute fracture or traumatic malalignment. 2. 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 MRI could better evaluate the canal and cord if clinically indicated. 3. Bilateral thyroid masses, previously evaluated by ultrasound on August 01, 2012.   Electronically Signed   By: Feliberto Harts   Reproductive/Obstetrics negative OB ROS                           Anesthesia Physical Anesthesia Plan  ASA: III  Anesthesia Plan: General   Post-op Pain Management:    Induction: Intravenous  PONV Risk Score and Plan: 3 and 4 or greater and Ondansetron and Dexamethasone  Airway Management Planned: Oral ETT  Additional Equipment:   Intra-op  Plan:   Post-operative Plan: Extubation in OR  Informed Consent: I have reviewed the patients History and Physical, chart, labs and discussed the procedure including the risks, benefits and alternatives for the proposed anesthesia with the patient or authorized representative who has indicated his/her understanding and acceptance.     Dental advisory given  Plan Discussed with: CRNA and Surgeon  Anesthesia Plan Comments: (Recent fall, neck pain, limited neck mobility, risk of neck pain getting worse after the procedure was explained Reviewed radiology reporrts)     Anesthesia Quick Evaluation

## 2020-08-18 NOTE — Transfer of Care (Signed)
Immediate Anesthesia Transfer of Care Note  Patient: Claire Barnett  Procedure(s) Performed: NEPHROLITHOTOMY PERCUTANEOUS. (Left Flank)  Patient Location: PACU  Anesthesia Type:General  Level of Consciousness: awake, alert  and oriented  Airway & Oxygen Therapy: Patient Spontanous Breathing  Post-op Assessment: Report given to RN and Post -op Vital signs reviewed and stable  Post vital signs: Reviewed and stable  Last Vitals:  Vitals Value Taken Time  BP 141/65   Temp    Pulse 87 08/18/20 1243  Resp 17 08/18/20 1243  SpO2 98 % 08/18/20 1243  Vitals shown include unvalidated device data.  Last Pain:  Vitals:   08/18/20 0827  TempSrc: Oral  PainSc: 2       Patients Stated Pain Goal: 6 (08/18/20 0827)  Complications: No complications documented.

## 2020-08-18 NOTE — H&P (Signed)
nephrolithiasis  HPI: Claire Barnett is a 84yo here for followup for nephrolithiasis. She is having intermittent left flank pain with associated gross hematuria. She underwent CT stone study which showed a 3cm left renal calculus   PMH:     Past Medical History:  Diagnosis Date  . Anxiety   . History of kidney stones   . Hypertension   . S/P colonoscopy August 2007   Rare sigmoid diverticulosis.  Otherwise, no polyps, inflammatory    Surgical History:      Past Surgical History:  Procedure Laterality Date  . ABDOMINAL HYSTERECTOMY    . BREAST LUMPECTOMY Right    benign  . CATARACT EXTRACTION W/PHACO Right 02/01/2013   Procedure: CATARACT EXTRACTION PHACO AND INTRAOCULAR LENS PLACEMENT (IOC);  Surgeon: Gemma Payor, MD;  Location: [facility];  Service: Ophthalmology;  Laterality: Right;  CDE:25.80  . COLONOSCOPY  05/12/2011   Procedure: COLONOSCOPY;  Surgeon: Arlyce Harman, MD;  Location: [facility];  Service: Endoscopy;  Laterality: N/A;  10:30AM  . CYSTOSCOPY W/ STONE MANIPULATION     x3  . kidney stones     X 4 procedures    Home Medications:  Allergies as of 07/16/2020   No Known Allergies        Medication List       Accurate as of July 16, 2020  4:11 PM. If you have any questions, ask your nurse or doctor.        ALPRAZolam 0.5 MG tablet Commonly known as: XANAX Take 0.5-1 mg by mouth 3 (three) times daily.   atenolol 50 MG tablet Commonly known as: TENORMIN Take 50 mg by mouth every morning.   cholecalciferol 1000 units tablet Commonly known as: VITAMIN D Take 1,000 Units by mouth daily.   ciprofloxacin 500 MG tablet Commonly known as: CIPRO Take 500 mg by mouth 2 (two) times daily.   gabapentin 100 MG capsule Commonly known as: NEURONTIN Take 100 mg by mouth 3 (three) times daily.   hydrochlorothiazide 25 MG tablet Commonly known as: HYDRODIURIL Take 25 mg by mouth every morning.   loteprednol 0.5 % ophthalmic  suspension Commonly known as: LOTEMAX PLACE 1 DROP IN EACH EYE 3 TIMES DAILY.   meclizine 25 MG tablet Commonly known as: ANTIVERT Take 25 mg by mouth every 6 (six) hours as needed.   meloxicam 7.5 MG tablet Commonly known as: MOBIC Take 1 tablet by mouth every 12 (twelve) hours.   ondansetron 4 MG tablet Commonly known as: ZOFRAN Take 4-8 mg by mouth every 6 (six) hours as needed.   sertraline 25 MG tablet Commonly known as: ZOLOFT Take 25 mg by mouth daily.   Xarelto 20 MG Tabs tablet Generic drug: rivaroxaban SMARTSIG:1 Tablet(s) By Mouth Every Evening       Allergies: No Known Allergies  Family History:      Family History  Problem Relation Age of Onset  . Colon cancer Sister        diagnosed late 20s.   . Colon cancer Mother        diagnosed in mid to late 26s. Deceased age 25    Social History:  reports that she has never smoked. She has never used smokeless tobacco. She reports that she does not drink alcohol and does not use drugs.  ROS: All other review of systems were reviewed and are negative except what is noted above in HPI  Physical Exam: BP (!) 150/75   Pulse 84   Temp 98.3  F (36.8 C)   Ht 5' (1.524 m)   Wt 130 lb (59 kg)   BMI 25.39 kg/m   Constitutional:  Alert and oriented, No acute distress. HEENT: [place] AT, moist mucus membranes.  Trachea midline, no masses. Cardiovascular: No clubbing, cyanosis, or edema. Respiratory: Normal respiratory effort, no increased work of breathing. GI: Abdomen is soft, nontender, nondistended, no abdominal masses GU: No CVA tenderness.  Lymph: No cervical or inguinal lymphadenopathy. Skin: No rashes, bruises or suspicious lesions. Neurologic: Grossly intact, no focal deficits, moving all 4 extremities. Psychiatric: Normal mood and affect.  Laboratory Data: Recent Labs       Lab Results  Component Value Date   WBC 5.6 09/10/2013   HGB 11.4 (L) 09/10/2013   HCT 31.3 (L) 09/10/2013    MCV 89.9 09/10/2013   PLT 140 (L) 09/10/2013      Recent Labs       Lab Results  Component Value Date   CREATININE 1.00 09/09/2017      Recent Labs  No results found for: PSA    Recent Labs  No results found for: TESTOSTERONE    Recent Labs  No results found for: HGBA1C    Urinalysis Labs (Brief)          Component Value Date/Time   COLORURINE AMBER (A) 09/07/2013 2045   APPEARANCEUR Cloudy (A) 06/27/2020 1336   LABSPEC 1.025 09/07/2013 2045   PHURINE 6.5 09/07/2013 2045   GLUCOSEU Negative 06/27/2020 1336   HGBUR LARGE (A) 09/07/2013 2045   BILIRUBINUR Negative 06/27/2020 1336   KETONESUR 15 (A) 09/07/2013 2045   PROTEINUR 2+ (A) 06/27/2020 1336   PROTEINUR >300 (A) 09/07/2013 2045   UROBILINOGEN 0.2 09/07/2013 2045   NITRITE Negative 06/27/2020 1336   NITRITE NEGATIVE 09/07/2013 2045   LEUKOCYTESUR Trace (A) 06/27/2020 1336      Recent Labs       Lab Results  Component Value Date   LABMICR See below: 06/27/2020   WBCUA 0-5 06/27/2020   LABEPIT 0-10 06/27/2020   BACTERIA Moderate (A) 06/27/2020      Pertinent Imaging: CT stone study 07/16/2020: Images reviewed and discussed with the patient No results found for this or any previous visit.  No results found for this or any previous visit.  No results found for this or any previous visit.  No results found for this or any previous visit.  No results found for this or any previous visit.  No results found for this or any previous visit.  No results found for this or any previous visit.  Results for orders placed during the hospital encounter of 07/16/20  CT RENAL STONE STUDY  Narrative CLINICAL DATA:  Left flank pain and hematuria for 2 months.  EXAM: CT ABDOMEN AND PELVIS WITHOUT CONTRAST  TECHNIQUE: Multidetector CT imaging of the abdomen and pelvis was performed following the standard protocol without IV contrast.  COMPARISON:   09/09/2017  FINDINGS: Lower chest: Minimal basilar scarring changes. No infiltrates or effusions. No worrisome pulmonary lesions. The heart is mildly enlarged but stable. Stable aortic and coronary artery calcifications.  Hepatobiliary: No hepatic lesions are identified without contrast. No intrahepatic biliary dilatation. The gallbladder is normal. No common bile duct dilatation.  Pancreas: Mild pancreatic atrophy but no mass, inflammation or ductal dilatation.  Spleen: Normal size.  No focal lesions.  Adrenals/Urinary Tract: The adrenal glands are unremarkable and stable.  Again demonstrated is a large staghorn type calculus in the left renal pelvis. This measures approximately  3.3 x 3.0 x 2.5 cm. There is also a lower pole calculus measuring 8.5 mm. Probable mild chronic UPJ obstruction but no obstructing ureteral calculi.  No right-sided renal or ureteral calculi. There are bilateral simple appearing renal cysts noted.  No bladder mass or bladder calculi are identified.  Stomach/Bowel: The stomach, duodenum, small bowel and colon are grossly normal without oral contrast. No acute inflammatory changes, mass lesions or obstructive findings. There is scattered diffuse colonic diverticulosis but no findings for acute diverticulitis. Moderate stool throughout the colon and down into the rectum could suggest constipation.  Vascular/Lymphatic: Minimal scattered aortic and branch vessel calcifications for age. No aneurysm. No mesenteric or retroperitoneal mass or adenopathy.  Reproductive: Surgically absent.  Other: No pelvic mass or adenopathy. No free pelvic fluid collections. No inguinal mass or adenopathy. No abdominal wall hernia or subcutaneous lesions. Bilateral inguinal hernias containing fat are noted.  Musculoskeletal: No acute or significant bony findings. Stable degenerative lumbar facet disease.  IMPRESSION: 1. Large staghorn type calculus in  the left renal pelvis with probable mild chronic UPJ obstruction. Slight interval enlargement since 2019. 2. Lower pole left renal calculus. 3. No obstructing ureteral calculi or bladder calculi. 4. Bilateral simple appearing renal cysts. 5. No acute abdominal/pelvic findings, mass lesions or adenopathy. 6. Moderate stool throughout the colon and down into the rectum could suggest constipation.   Electronically Signed By: Rudie Meyer M.D. On: 07/16/2020 11:10   Assessment & Plan:    1. Left staghorn calculus -We discussed the management of kidney stones. These options include observation, ureteroscopy, shockwave lithotripsy (ESWL) and percutaneous nephrolithotomy (PCNL). We discussed which options are relevant to the patient's stone(s). We discussed the natural history of kidney stones as well as the complications of untreated stones and the impact on quality of life without treatment as well as with each of the above listed treatments. We also discussed the efficacy of each treatment in its ability to clear the stone burden. With any of these management options I discussed the signs and symptoms of infection and the need for emergent treatment should these be experienced. For each option we discussed the ability of each procedure to clear the patient of their stone burden.   For observation I described the risks which include but are not limited to silent renal damage, life-threatening infection, need for emergent surgery, failure to pass stone and pain.   For ureteroscopy I described the risks which include bleeding, infection, damage to contiguous structures, positioning injury, ureteral stricture, ureteral avulsion, ureteral injury, need for prolonged ureteral stent, inability to perform ureteroscopy, need for an interval procedure, inability to clear stone burden, stent discomfort/pain, heart attack, stroke, pulmonary embolus and the inherent risks with general anesthesia.   For  shockwave lithotripsy I described the risks which include arrhythmia, kidney contusion, kidney hemorrhage, need for transfusion, pain, inability to adequately break up stone, inability to pass stone fragments, Steinstrasse, infection associated with obstructing stones, need for alternate surgical procedure, need for repeat shockwave lithotripsy, MI, CVA, PE and the inherent risks with anesthesia/conscious sedation.   For PCNL I described the risks including positioning injury, pneumothorax, hydrothorax, need for chest tube, inability to clear stone burden, renal laceration, arterial venous fistula or malformation, need for embolization of kidney, loss of kidney or renal function, need for repeat procedure, need for prolonged nephrostomy tube, ureteral avulsion, MI, CVA, PE and the inherent risks of general anesthesia.   - The patient would like to proceed with left PCNL.

## 2020-08-18 NOTE — Op Note (Signed)
Preoperative diagnosis: Left renal stone  Postoperative diagnosis: Same  Procedure 1.  Left percutaneous nephrostolithotomy for stone greater than 2 cm 2.  Left nephrostogram 3.  Intraoperative fluoroscopy, under 1 hour, with interpretation 4.  Placement of a 6 x 26 double-J ureteral stent. 5.  Placement of a 20 French nephrostomy tube 6.  Dilation of percutaneous tract  Attending: Wilkie Aye  Anesthesia: General  Estimated blood loss: 400cc  Antibiotics: ancef  Drains: 1.  16 French Foley catheter 2.  6 x 26 left double-J ureteral stent 3.  20 French nephrostomy tube  Specimens: Stone for analysis  Findings: 3.5cm staghorn calculus involving mid and lower poles.  Indications: Patient is a 84 year old female with a history of large left renal stones.  After discussing treatment options and decided she was left percutaneous nephrostolithotomy.  Patient already has a nephrostomy tube.  Procedure in detail: Prior to procedure consent was obtained.  Patient was brought to the operating room debridement was done to ensure correct patient, correct procedure, and correct site.  General anesthesia was administered.  A 16 French Foley catheter was in place.  The patient was then placed in the prone position.  His nephrostomy tube and left flank was then prepped and draped in usual sterile fashion.  A nephrostogram was obtained and findings noted above.  Through the nephrostomy tube we then placed a sensor wire.  Sensor wire was coiled in the renal pelvis we then removed the nephrostomy tube.  We then made an incision at the level of the skin and over the wire we then placed a NephroMax dilator.  We dilated the nephrostomy tract to 30 [place] and held this 18 cm of water for 1 minute.  We then  placed the access sheath over the balloon.  The balloon was then deflated.  We then used a rigid nephroscope to perform nephroscopy.  We encountered a large renal pelvis stone extending into the mid  and lower poles. Using the trilogy the stone was fragmented and the fragments were irrigated from the kidney. Once the stone fragments were removed we placed  a 6 x 26 double-J ureteral stent over the sensor wire.  The wire was then removed and good coil was noted in the renal pelvis under direct vision in the bladder under fluoroscopy.  We then placed a 20 French nephrostomy tube through the sheath into the renal pelvis.  The balloon was inflated with 67ml of contrast.  We then removed the access sheath in and obtain another nephrostogram.  We noted minimal extravasation of contrast.  We then secured the nephrostomy tubes with 0 silks in interrupted fashion.  Dressing was placed over the nephrostomy tube site and this then concluded the procedure was well-tolerated by the patient.  Complications: None  Condition: Stable, extubated, transferred to PACU  Plan: Patient is to be admitted overnight for observation.  Her Foley catheter through the morning.  She is then to be discharged home and followup in 3 days with a CT stone study

## 2020-08-19 ENCOUNTER — Other Ambulatory Visit: Payer: Self-pay

## 2020-08-19 ENCOUNTER — Telehealth: Payer: Self-pay

## 2020-08-19 ENCOUNTER — Encounter (HOSPITAL_COMMUNITY): Payer: Self-pay | Admitting: Urology

## 2020-08-19 DIAGNOSIS — N200 Calculus of kidney: Secondary | ICD-10-CM

## 2020-08-19 DIAGNOSIS — I10 Essential (primary) hypertension: Secondary | ICD-10-CM | POA: Diagnosis not present

## 2020-08-19 DIAGNOSIS — Z87442 Personal history of urinary calculi: Secondary | ICD-10-CM | POA: Diagnosis not present

## 2020-08-19 DIAGNOSIS — R109 Unspecified abdominal pain: Secondary | ICD-10-CM

## 2020-08-19 LAB — BASIC METABOLIC PANEL
Anion gap: 8 (ref 5–15)
BUN: 13 mg/dL (ref 8–23)
CO2: 31 mmol/L (ref 22–32)
Calcium: 9 mg/dL (ref 8.9–10.3)
Chloride: 98 mmol/L (ref 98–111)
Creatinine, Ser: 0.87 mg/dL (ref 0.44–1.00)
GFR, Estimated: >60 mL/min (ref >60)
Glucose, Bld: 114 mg/dL — ABNORMAL HIGH (ref 70–99)
Potassium: 3.4 mmol/L — ABNORMAL LOW (ref 3.5–5.1)
Sodium: 137 mmol/L (ref 135–145)

## 2020-08-19 LAB — CBC
HCT: 30.1 % — ABNORMAL LOW (ref 36.0–46.0)
Hemoglobin: 9.6 g/dL — ABNORMAL LOW (ref 12.0–15.0)
MCH: 30 pg (ref 26.0–34.0)
MCHC: 31.9 g/dL (ref 30.0–36.0)
MCV: 94.1 fL (ref 80.0–100.0)
Platelets: 190 10*3/uL (ref 150–400)
RBC: 3.2 MIL/uL — ABNORMAL LOW (ref 3.87–5.11)
RDW: 11.9 % (ref 11.5–15.5)
WBC: 8.6 10*3/uL (ref 4.0–10.5)
nRBC: 0 % (ref 0.0–0.2)

## 2020-08-19 MED ORDER — OXYCODONE-ACETAMINOPHEN 5-325 MG PO TABS: 1.0000 | ORAL_TABLET | ORAL | 0 refills | Status: DC | PRN

## 2020-08-19 MED ORDER — CHLORHEXIDINE GLUCONATE CLOTH 2 % EX PADS: 6.0000 | MEDICATED_PAD | Freq: Every day | CUTANEOUS | Status: DC

## 2020-08-19 NOTE — Discharge Instructions (Signed)
Percutaneous Nephrolithotomy, Care After °This sheet gives you information about how to care for yourself after your procedure. Your health care provider may also give you more specific instructions. If you have problems or questions, contact your health care provider. °What can I expect after the procedure? °After the procedure, it is common to have: °· Soreness or pain. °· A small amount of blood or clear fluid coming from your incision for a few days. °· Fatigue. °· Some blood in your urine. This will last for a few days. °Follow these instructions at home: °Medicines °· Take over-the-counter and prescription medicines only as told by your health care provider. °· If you were prescribed an antibiotic medicine, take it as told by your health care provider. Do not stop using the antibiotic even if you start to feel better. °· Ask your health care provider if the medicine prescribed to you: °? Requires you to avoid driving or using heavy machinery. °? Can cause constipation. You may need to take these actions to prevent or treat constipation: °§ Drink enough fluid to keep your urine pale yellow. °§ Take over-the-counter or prescription medicines. °§ Eat foods that are high in fiber, such as beans, whole grains, and fresh fruits and vegetables. °§ Limit foods that are high in fat and processed sugars, such as fried or sweet foods. °Incision care ° °· Follow instructions from your health care provider about how to take care of your incision. Make sure you: °? Wash your hands with soap and water before and after you change your bandage (dressing). If soap and water are not available, use hand sanitizer. °? Change your dressing as told by your health care provider. °? Leave stitches (sutures), skin glue, or adhesive strips in place. These skin closures may need to stay in place for 2 weeks or longer. If adhesive strip edges start to loosen and curl up, you may trim the loose edges. Do not remove adhesive strips  completely unless your health care provider tells you to do that. °· Check your incision area every day for signs of infection. Check for: °? Redness, swelling, or pain. °? More fluid or blood. °? Warmth. °? Pus or a bad smell. °· Do not take baths, swim, or use a hot tub until your health care provider approves. Ask your health care provider if you may take showers. You may only be allowed to take sponge baths. °Activity °· Avoid strenuous activities for as long as told by your health care provider. °· Return to your normal activities as told by your health care provider. Ask your health care provider what activities are safe for you. °General instructions °· If you were sent home with a catheter or surgical drain (nephrostomy tube), follow your health care provider's instructions on how to take care of it. °· Wear compression stockings as told by your health care provider. These stockings help to prevent blood clots and reduce swelling in your legs. °· Do not use any products that contain nicotine or tobacco, such as cigarettes, e-cigarettes, and chewing tobacco. These can delay incision healing after surgery. If you need help quitting, ask your health care provider. °· Keep all follow-up visits as told by your health care provider. This is important. °? If you still have a stent, your health care provider will need to remove it after 1-2 weeks. °Contact a health care provider if: °· You have a fever. °· You have redness, swelling, or pain around your incision. °· You have more   fluid or blood coming from your incision. °· Your incision feels warm to the touch. °· You have pus or a bad smell coming from your incision. °· You lose your appetite. °· You feel nauseous or you vomit. °· You have been sent home with a urinary catheter or a surgical drain and urine flow suddenly stops, followed by kidney pain. °Get help right away if: °· You notice a large amount of blood in your urine. °· You have blood clots in your  urine. °· You cannot urinate. °· You have chest pain or trouble breathing. °Summary °· After the procedure, it is common to feel soreness and discomfort. You may also see blood in your urine. °· You will be told how to care for yourself after the procedure. Follow instructions on how to care for your incision and how to recognize signs of infection. °· Avoid activities that require great effort. Return to activities as told by your health care provider. °· Get help right away if you have blood clots in your urine, you cannot urinate, or you have trouble breathing. °This information is not intended to replace advice given to you by your health care provider. Make sure you discuss any questions you have with your health care provider. °Document Revised: 03/08/2018 Document Reviewed: 03/08/2018 °Elsevier Patient Education © 2020 [employer] ° °

## 2020-08-19 NOTE — Progress Notes (Signed)
Pt discharged via WC to POV with all personal belongings in her possession. Pt did ambulate to BR with one assist (standby) and voided 250 ml of pink tinged clear urine. Nephrostomy tube drainage bag emptied of 750 ml of blood tinged urine. Both patient and her sister Rico Ala were instructed on care and emptying of nephrostomy tube and drainage bag. Sister able to give hands on return demonstration of emptying drainage bag. DDI to left flank, reinforced with fabric tape for security. Advised to notify Dr. Ronne Binning if any bleeding or drainage noted from tube site/on dressing or if tubing is dislodged. Both stated understanding. Pt stated still felt a little dizzy upon first standing. Advised pt to move slowly and take no sudden movements to allow dizziness to abate before trying to walk. Sister advised that pt will have another family member with her tonight for safety.

## 2020-08-19 NOTE — Progress Notes (Signed)
Pt up to bathroom with assistance. Pt very dizzy with movement, required this staff member to steady her with rising, walking and sitting. Pt states, "My head feels so wobbly, like it did when I fell at home." Pt voided 100 ml of blood tinged urine in toilet. Pt had also leaked some urine on bed pad incontinently. Pt denies any pain or burning with void. Back to bed, abd palpated. No distention of lower abd/bladder area noted and no discomfort with palpation. Bladder scanned performed post void with <144ml residual noted. Urine draining from nephrostomy tube is blood tinged but much lighter in color than earlier this am.  MD McKenzie notified of pt's urine output and complaint of continued dizziness.

## 2020-08-19 NOTE — Telephone Encounter (Signed)
Received call from [facility] nurse concerning pt discharge. Concerns and questions pertaining to pt physical and emotional state addressed with Clinical research associate and relayed to Dr. Ronne Binning via phone. Per Dr. Ronne Binning pt is stable enough to discharge home. [facility] nurse called and made aware and voiced understanding.

## 2020-08-19 NOTE — Progress Notes (Signed)
Have not heard back from notification of MD McKenzie via message left on office phone. Have called office number 3 more times without success. On fourth attempt, talked with office staff who stated MD is not in office today. No other MD on pt's record. Office staff states they will attempt to contact MD at home with pt update and further instructions.

## 2020-08-19 NOTE — Progress Notes (Signed)
Hope from Dr. Dimas Millin office called and stated that she had relayed message regarding pt's urine output and her continued dizziness to Dr. Ronne Binning. Per Dr. Ronne Binning, pt's void of 100 ml since foley out is OK, as her nephrostomy tube output is > 300 ml. As for the dizziness, he stated pt was fine this am and her dizziness should not delay her discharge.

## 2020-08-26 ENCOUNTER — Ambulatory Visit (HOSPITAL_COMMUNITY)
Admission: RE | Admit: 2020-08-26 | Discharge: 2020-08-26 | Disposition: A | Discharge status: Home / Self Care | Payer: Medicare HMO | Source: Ambulatory Visit | Attending: Urology | Admitting: Urology

## 2020-08-26 ENCOUNTER — Other Ambulatory Visit: Payer: Self-pay

## 2020-08-26 DIAGNOSIS — N200 Calculus of kidney: Secondary | ICD-10-CM | POA: Diagnosis not present

## 2020-08-26 DIAGNOSIS — N1330 Unspecified hydronephrosis: Secondary | ICD-10-CM | POA: Diagnosis not present

## 2020-08-26 DIAGNOSIS — M47816 Spondylosis without myelopathy or radiculopathy, lumbar region: Secondary | ICD-10-CM | POA: Diagnosis not present

## 2020-08-26 DIAGNOSIS — R310 Gross hematuria: Secondary | ICD-10-CM | POA: Diagnosis not present

## 2020-08-26 DIAGNOSIS — R109 Unspecified abdominal pain: Secondary | ICD-10-CM | POA: Insufficient documentation

## 2020-08-26 NOTE — Discharge Summary (Signed)
Physician Discharge Summary  Patient ID: Claire Barnett MRN: [id number] DOB/AGE: 1936-05-01 84 y.o.  Admit date: 08/18/2020 Discharge date: 08/19/2020  Admission Diagnoses:  Renal calculus  Discharge Diagnoses:  Active Problems:   Renal calculus   Past Medical History:  Diagnosis Date  . Anxiety   . History of kidney stones   . Hypertension   . S/P colonoscopy August 2007   Rare sigmoid diverticulosis.  Otherwise, no polyps, inflammatory    Surgeries: Procedure(s): NEPHROLITHOTOMY PERCUTANEOUS. on 08/18/2020   Consultants (if any):   Discharged Condition: Improved  Hospital Course: RIONA LAHTI is an 84 y.o. female who was admitted 08/18/2020 with a diagnosis of <principal problem not specified> and went to the operating room on 08/18/2020 and underwent the above named procedures.    She was given perioperative antibiotics:  Anti-infectives (From admission, onward)   Start     Dose/Rate Route Frequency Ordered Stop   08/18/20 1000  cefTRIAXone (ROCEPHIN) 2 g in sodium chloride 0.9 % 100 mL IVPB        2 g 200 mL/hr over 30 Minutes Intravenous 30 min pre-op 08/18/20 0830 08/18/20 1243    .  She was given sequential compression devices, early ambulation,  for DVT prophylaxis.  She benefited maximally from the hospital stay and there were no complications.    Recent vital signs:  Vitals:   08/19/20 0849 08/19/20 1509  BP: 132/69 127/61  Pulse: 79 91  Resp: 16 20  Temp: 98.1 F (36.7 C) 98.4 F (36.9 C)  SpO2: 100% 98%    Recent laboratory studies:  Lab Results  Component Value Date   HGB 9.6 (L) 08/19/2020   HGB 10.9 (L) 08/18/2020   HGB 11.6 (L) 08/18/2020   Lab Results  Component Value Date   WBC 8.6 08/19/2020   PLT 190 08/19/2020   Lab Results  Component Value Date   INR 1.2 08/15/2020   Lab Results  Component Value Date   NA 137 08/19/2020   K 3.4 (L) 08/19/2020   CL 98 08/19/2020   CO2 31 08/19/2020   BUN 13 08/19/2020   CREATININE  0.87 08/19/2020   GLUCOSE 114 (H) 08/19/2020    Discharge Medications:   Allergies as of 08/19/2020   No Known Allergies     Medication List    STOP taking these medications   Xarelto 20 MG Tabs tablet Generic drug: rivaroxaban     TAKE these medications   ALPRAZolam 0.5 MG tablet Commonly known as: XANAX Take 0.5-1 mg by mouth 3 (three) times daily.   cholecalciferol 1000 units tablet Commonly known as: VITAMIN D Take 1,000 Units by mouth daily.   gabapentin 100 MG capsule Commonly known as: NEURONTIN Take 100 mg by mouth 3 (three) times daily.   hydrochlorothiazide 25 MG tablet Commonly known as: HYDRODIURIL Take 25 mg by mouth every morning.   meclizine 25 MG tablet Commonly known as: ANTIVERT Take 25 mg by mouth every 6 (six) hours as needed for dizziness.   ondansetron 4 MG tablet Commonly known as: ZOFRAN Take 4-8 mg by mouth every 6 (six) hours as needed for nausea or vomiting.   oxyCODONE-acetaminophen 5-325 MG tablet Commonly known as: PERCOCET/ROXICET Take 1-2 tablets by mouth every 4 (four) hours as needed for moderate pain.   potassium chloride SA 20 MEQ tablet Commonly known as: KLOR-CON Take 1 tablet (20 mEq total) by mouth daily.   sertraline 25 MG tablet Commonly known as: ZOLOFT Take 25 mg by  mouth daily.   traMADol 50 MG tablet Commonly known as: Ultram Take 1 tablet (50 mg total) by mouth every 6 (six) hours as needed.       Diagnostic Studies: CT Head Wo Contrast  Result Date: 08/16/2020 CLINICAL DATA:  Dizziness.  Head injury. EXAM: CT HEAD WITHOUT CONTRAST CT CERVICAL SPINE WITHOUT CONTRAST TECHNIQUE: Multidetector CT imaging of the head and cervical spine was performed following the standard protocol without intravenous contrast. Multiplanar CT image reconstructions of the cervical spine were also generated. COMPARISON:  None. FINDINGS: CT HEAD FINDINGS Brain: No evidence of acute infarction, hemorrhage, hydrocephalus, extra-axial  collection or mass lesion/mass effect. Patchy white matter hypoattenuation, most likely related to chronic microvascular ischemic disease. Mild generalized volume loss. Vascular: Calcific atherosclerosis. Skull: No acute fracture. Sinuses/Orbits: Evidence of chronic right maxillary sinusitis with mucosal thickening and maxillary sinus wall thickening. Other: No mastoid effusions. CT CERVICAL SPINE FINDINGS Alignment: No substantial subluxation. Skull base and vertebrae: No evidence of acute fracture. Vertebral body heights are maintained. Soft tissues and spinal canal: No prevertebral fluid or swelling. No visible canal hematoma. Disc levels: Moderate multilevel degenerative change. Degenerative cystic change of the dens. 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 Upper chest: No acute findings in the lung apices. Other: Bilateral thyroid masses, previously evaluated by ultrasound on August 01, 2012. IMPRESSION: CT head: 1. No evidence of acute intracranial abnormality. 2. Evidence of chronic right maxillary sinusitis. CT cervical spine: 1. No evidence of acute fracture or traumatic malalignment. 2. 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 MRI could better evaluate the canal and cord if clinically indicated. 3. Bilateral thyroid masses, previously evaluated by ultrasound on August 01, 2012. Electronically Signed   By: Feliberto Harts MD   On: 08/16/2020 10:49   CT Cervical Spine Wo Contrast  Result Date: 08/16/2020 CLINICAL DATA:  Dizziness.  Head injury. EXAM: CT HEAD WITHOUT CONTRAST CT CERVICAL SPINE WITHOUT CONTRAST TECHNIQUE: Multidetector CT imaging of the head and cervical spine was performed following the standard protocol without intravenous contrast. Multiplanar CT image reconstructions of the cervical spine were also generated. COMPARISON:  None. FINDINGS: CT HEAD FINDINGS Brain: No evidence of acute infarction,  hemorrhage, hydrocephalus, extra-axial collection or mass lesion/mass effect. Patchy white matter hypoattenuation, most likely related to chronic microvascular ischemic disease. Mild generalized volume loss. Vascular: Calcific atherosclerosis. Skull: No acute fracture. Sinuses/Orbits: Evidence of chronic right maxillary sinusitis with mucosal thickening and maxillary sinus wall thickening. Other: No mastoid effusions. CT CERVICAL SPINE FINDINGS Alignment: No substantial subluxation. Skull base and vertebrae: No evidence of acute fracture. Vertebral body heights are maintained. Soft tissues and spinal canal: No prevertebral fluid or swelling. No visible canal hematoma. Disc levels: Moderate multilevel degenerative change. Degenerative cystic change of the dens. 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 Upper chest: No acute findings in the lung apices. Other: Bilateral thyroid masses, previously evaluated by ultrasound on August 01, 2012. IMPRESSION: CT head: 1. No evidence of acute intracranial abnormality. 2. Evidence of chronic right maxillary sinusitis. CT cervical spine: 1. No evidence of acute fracture or traumatic malalignment. 2. 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 MRI could better evaluate the canal and cord if clinically indicated. 3. Bilateral thyroid masses, previously evaluated by ultrasound on August 01, 2012. Electronically Signed   By: Feliberto Harts MD   On: 08/16/2020 10:49   DG  C-Arm 1-60 Min-No Report  Result Date: 08/18/2020 Fluoroscopy was utilized by the requesting physician.  No radiographic interpretation.   IR NEPHROSTOMY PLACEMENT LEFT  Result Date: 08/15/2020 INDICATION: Renal stones, access for left percutaneous nephrolithotomy. EXAM: 1. Percutaneous puncture of the renal collecting system under fluoroscopic guidance 2. Placement of a percutaneous nephroureteral catheter Operating Physician:   Sterling Big, MD COMPARISON:  CT of the abdomen and pelvis-07/16/2020 MEDICATIONS: 400 mg Cipro; The antibiotic was administered in an appropriate time frame prior to skin puncture. ANESTHESIA/SEDATION: Fentanyl 50 mcg IV; Versed 1 mg IV Moderate Sedation Time:  21 minutes The patient was continuously monitored during the procedure by the interventional radiology nurse under my direct supervision. CONTRAST:  71mL OMNIPAQUE IOHEXOL 300 MG/ML SOLN - administered into the collecting system(s) FLUOROSCOPY TIME:  Fluoroscopy Time: 6 minutes 18 seconds (40 mGy). COMPLICATIONS: None immediate. PROCEDURE: Informed written consent was obtained from the patient after a thorough discussion of the procedural risks, benefits and alternatives. All questions were addressed. Maximal Sterile Barrier Technique was utilized including caps, mask, sterile gowns, sterile gloves, sterile drape, hand hygiene and skin antiseptic. A timeout was performed prior to the initiation of the procedure. A pre procedural spot fluoroscopic image was obtained of the upper abdomen. Ultrasound scanning performed of the kidney was negative for significant hydronephrosis. As such, the large staghorn calculus within the renal pelvis was targeted fluoroscopically with a 22 gauge Chiba needle. Access to the collecting system was confirmed with advancement of a Nitrex wire into the collecting system. The needle was exchanged for the inner 3 French catheter from an Accustick set and contrast injection confirmed access. A small amount of air was injected into the collecting system to help delineate a posterior calyx. A posterior inferior calyx was targeted with a 22 gauge Chiba needle. Access to the calyx was confirmed with advancement of a Nitrex wire into the collecting system. An Accustick set was utilized to dilate the tract and was subsequently exchanged for a Kumpe catheter over a Bentson wire. The Kumpe catheter was advanced down the ureter and  into the urinary bladder. Postprocedural spot radiographs were obtained in various obliquities and the catheter was sutured to the skin. The catheter was capped and a dressing was placed. The patient tolerated the procedure well without immediate postprocedural complication. IMPRESSION: Successful fluoroscopic guided left percutaneous nephrostomy with placement of a 5 French Kumpe catheter to the level of the urinary bladder to be utilized during impending nephrolithotomy procedure. Electronically Signed   By: Malachy Moan M.D.   On: 08/15/2020 16:18    Disposition: Discharge disposition: 01-Home or Self Care       Discharge Instructions    Discharge patient   Complete by: As directed    Discharge disposition: 01-Home or Self Care   Discharge patient date: 08/19/2020       Follow-up Information    Malen Gauze, MD Follow up on 08/27/2020.   Specialty: Urology Contact information: [address]  [place] [address] [phone]                Signed: Wilkie Aye 08/26/2020, 10:31 AM

## 2020-08-27 ENCOUNTER — Telehealth: Payer: Self-pay

## 2020-08-27 ENCOUNTER — Ambulatory Visit: Payer: Medicare HMO | Admitting: Urology

## 2020-08-27 NOTE — Telephone Encounter (Signed)
appt cancelled today due to possible covid exposure.  I spoke with patient on how she is feeling- pt reports her nephrostomy tube is draining without any issues. No fever.   I told patient I would in be touch this week for rescheduling. Pt voiced understanding.

## 2020-08-29 DIAGNOSIS — N183 Chronic kidney disease, stage 3 unspecified: Secondary | ICD-10-CM | POA: Diagnosis not present

## 2020-08-29 DIAGNOSIS — I129 Hypertensive chronic kidney disease with stage 1 through stage 4 chronic kidney disease, or unspecified chronic kidney disease: Secondary | ICD-10-CM | POA: Diagnosis not present

## 2020-09-03 DIAGNOSIS — M5412 Radiculopathy, cervical region: Secondary | ICD-10-CM | POA: Diagnosis not present

## 2020-09-03 DIAGNOSIS — Z6824 Body mass index (BMI) 24.0-24.9, adult: Secondary | ICD-10-CM | POA: Diagnosis not present

## 2020-09-03 DIAGNOSIS — M5030 Other cervical disc degeneration, unspecified cervical region: Secondary | ICD-10-CM | POA: Diagnosis not present

## 2020-09-03 DIAGNOSIS — Z1331 Encounter for screening for depression: Secondary | ICD-10-CM | POA: Diagnosis not present

## 2020-09-08 ENCOUNTER — Other Ambulatory Visit: Payer: Self-pay

## 2020-09-08 ENCOUNTER — Ambulatory Visit (INDEPENDENT_AMBULATORY_CARE_PROVIDER_SITE_OTHER): Payer: Medicare HMO | Admitting: Urology

## 2020-09-08 VITALS — BP 135/76 | HR 80 | Temp 98.3°F | Ht 60.0 in | Wt 130.1 lb

## 2020-09-08 DIAGNOSIS — N200 Calculus of kidney: Secondary | ICD-10-CM

## 2020-09-08 NOTE — Progress Notes (Signed)
Urological Symptom Review  Patient is experiencing the following symptoms: Frequent urination Get up at night to urinate Blood in urine Injury to kidneys/bladder  Kidney stones  Review of Systems  Gastrointestinal (upper)  : Negative for upper GI symptoms  Gastrointestinal (lower) : Constipation  Constitutional : Fatigue  Skin: Negative for skin symptoms  Eyes: Negative for eye symptoms  Ear/Nose/Throat : Negative for Ear/Nose/Throat symptoms  Hematologic/Lymphatic: Negative for Hematologic/Lymphatic symptoms  Cardiovascular : Negative for cardiovascular symptoms  Respiratory : Negative for respiratory symptoms  Endocrine: Negative for endocrine symptoms  Musculoskeletal: Back pain  Neurological: headache  Psychologic: Depression Anxiety

## 2020-09-09 ENCOUNTER — Encounter: Payer: Self-pay | Admitting: Urology

## 2020-09-09 NOTE — Progress Notes (Signed)
09/08/2020 8:40 AM   Claire Barnett Domenick Gong 06-04-1936 [id number]  Referring provider: Elfredia Nevins, MD [address] [place],  [place] [address]  followup after PCNL  HPI: Ms Winfree is a 84yo here for followup after left PCNL. CT shows no residual left renal calculi. NO flank pain. No significant LUTS   PMH: Past Medical History:  Diagnosis Date  . Anxiety   . History of kidney stones   . Hypertension   . S/P colonoscopy August 2007   Rare sigmoid diverticulosis.  Otherwise, no polyps, inflammatory    Surgical History: Past Surgical History:  Procedure Laterality Date  . ABDOMINAL HYSTERECTOMY    . BREAST LUMPECTOMY Right    benign  . CATARACT EXTRACTION W/PHACO Right 02/01/2013   Procedure: CATARACT EXTRACTION PHACO AND INTRAOCULAR LENS PLACEMENT (IOC);  Surgeon: Gemma Payor, MD;  Location: [facility];  Service: Ophthalmology;  Laterality: Right;  CDE:25.80  . COLONOSCOPY  05/12/2011   Procedure: COLONOSCOPY;  Surgeon: Arlyce Harman, MD;  Location: [facility];  Service: Endoscopy;  Laterality: N/A;  10:30AM  . CYSTOSCOPY W/ STONE MANIPULATION     x3  . IR NEPHROSTOMY PLACEMENT LEFT  08/15/2020  . kidney stones     X 4 procedures  . NEPHROLITHOTOMY Left 08/18/2020   Procedure: NEPHROLITHOTOMY PERCUTANEOUS.;  Surgeon: Malen Gauze, MD;  Location: [facility];  Service: Urology;  Laterality: Left;    Home Medications:  Allergies as of 09/08/2020   No Known Allergies     Medication List       Accurate as of September 08, 2020 11:59 PM. If you have any questions, ask your nurse or doctor.        STOP taking these medications   meclizine 25 MG tablet Commonly known as: ANTIVERT Stopped by: Wilkie Aye, MD   ondansetron 4 MG tablet Commonly known as: ZOFRAN Stopped by: Wilkie Aye, MD   oxyCODONE-acetaminophen 5-325 MG tablet Commonly known as: PERCOCET/ROXICET Stopped by: Wilkie Aye, MD   potassium chloride SA 20 MEQ tablet Commonly known  as: KLOR-CON Stopped by: Wilkie Aye, MD   sertraline 25 MG tablet Commonly known as: ZOLOFT Stopped by: Wilkie Aye, MD   traMADol 50 MG tablet Commonly known as: Ultram Stopped by: Wilkie Aye, MD     TAKE these medications   ALPRAZolam 0.5 MG tablet Commonly known as: XANAX Take 0.5-1 mg by mouth 3 (three) times daily.   cholecalciferol 1000 units tablet Commonly known as: VITAMIN D Take 1,000 Units by mouth daily.   DULoxetine 30 MG capsule Commonly known as: CYMBALTA Take 60 mg by mouth daily.   gabapentin 100 MG capsule Commonly known as: NEURONTIN Take 100 mg by mouth 3 (three) times daily.   hydrochlorothiazide 25 MG tablet Commonly known as: HYDRODIURIL Take 25 mg by mouth every morning.       Allergies: No Known Allergies  Family History: Family History  Problem Relation Age of Onset  . Colon cancer Sister        diagnosed late 94s.   . Colon cancer Mother        diagnosed in mid to late 51s. Deceased age 27    Social History:  reports that she has never smoked. She has never used smokeless tobacco. She reports that she does not drink alcohol and does not use drugs.  ROS: All other review of systems were reviewed and are negative except what is noted above in HPI  Physical Exam: BP 135/76   Pulse  80   Temp 98.3 F (36.8 C)   Ht 5' (1.524 m)   Wt 130 lb 1.1 oz (59 kg)   BMI 25.40 kg/m   Constitutional:  Alert and oriented, No acute distress. HEENT: [place] AT, moist mucus membranes.  Trachea midline, no masses. Cardiovascular: No clubbing, cyanosis, or edema. Respiratory: Normal respiratory effort, no increased work of breathing. GI: Abdomen is soft, nontender, nondistended, no abdominal masses GU: No CVA tenderness.  Lymph: No cervical or inguinal lymphadenopathy. Skin: No rashes, bruises or suspicious lesions. Neurologic: Grossly intact, no focal deficits, moving all 4 extremities. Psychiatric: Normal mood and  affect.  Laboratory Data: Lab Results  Component Value Date   WBC 8.6 08/19/2020   HGB 9.6 (L) 08/19/2020   HCT 30.1 (L) 08/19/2020   MCV 94.1 08/19/2020   PLT 190 08/19/2020    Lab Results  Component Value Date   CREATININE 0.87 08/19/2020    No results found for: PSA  No results found for: TESTOSTERONE  No results found for: HGBA1C  Urinalysis    Component Value Date/Time   COLORURINE AMBER (A) 09/07/2013 2045   APPEARANCEUR Clear 07/16/2020 1528   LABSPEC 1.025 09/07/2013 2045   PHURINE 6.5 09/07/2013 2045   GLUCOSEU Negative 07/16/2020 1528   HGBUR LARGE (A) 09/07/2013 2045   BILIRUBINUR Negative 07/16/2020 1528   KETONESUR 15 (A) 09/07/2013 2045   PROTEINUR 2+ (A) 07/16/2020 1528   PROTEINUR >300 (A) 09/07/2013 2045   UROBILINOGEN 0.2 09/07/2013 2045   NITRITE Negative 07/16/2020 1528   NITRITE NEGATIVE 09/07/2013 2045   LEUKOCYTESUR 1+ (A) 07/16/2020 1528    Lab Results  Component Value Date   LABMICR See below: 07/16/2020   WBCUA 11-30 (A) 07/16/2020   LABEPIT >10 (A) 07/16/2020   BACTERIA Many (A) 07/16/2020    Pertinent Imaging: CT stone study 08/26/2020: Images reviewed and discussed with the patient No results found for this or any previous visit.  No results found for this or any previous visit.  No results found for this or any previous visit.  No results found for this or any previous visit.  No results found for this or any previous visit.  No results found for this or any previous visit.  No results found for this or any previous visit.  Results for orders placed during the hospital encounter of 08/26/20  CT RENAL STONE STUDY  Narrative CLINICAL DATA:  Intermittent left flank pain. Gross hematuria. Prior percutaneous nephrolithotomy on 08/18/2020  EXAM: CT ABDOMEN AND PELVIS WITHOUT CONTRAST  TECHNIQUE: Multidetector CT imaging of the abdomen and pelvis was performed following the standard protocol without IV  contrast.  COMPARISON:  07/16/2020  FINDINGS: Lower chest: Bandlike scarring extending sagittally in the left lower lobe. Mild scarring medially in the right middle lobe. Left anterior descending, right, and circumflex coronary artery atherosclerosis.  Hepatobiliary: Unremarkable  Pancreas: Unremarkable  Spleen: Unremarkable  Adrenals/Urinary Tract: Mild fullness of the left adrenal gland without discrete mass. There are about three nonobstructive right renal calculi in the 1-2 mm size range. Small hypodense lesions in the right mid kidney are again observed, the larger measuring about 1.2 cm in diameter, most likely a cyst.  Interval removal of the large left staghorn calculus. Left percutaneous nephrostomy catheter is in place along with a small amount of gas in the collecting system. There is also a left double-J ureteral stent in place. Borderline left hydronephrosis. No substantial hydroureter. Small amount of gas in the urinary bladder. No significant residual  left nephrolithiasis. No ureteral stones on the left along side the stent.  Exophytic 3.1 by 2.7 cm fluid density left kidney lower pole lesion favoring cyst.  Stomach/Bowel: Scattered sigmoid colon diverticula. Prominent stool throughout the colon favors constipation.  Vascular/Lymphatic: Aortoiliac atherosclerotic vascular disease.  Reproductive: Uterus absent. Adnexa unremarkable.  Other: No supplemental non-categorized findings.  Musculoskeletal: Lower lumbar spondylosis and degenerative disc disease with grade 1 anterolisthesis at L4-5 and L5-S1 and suspected multilevel impingement.  IMPRESSION: 1. Interval removal of the large left staghorn calculus with a left percutaneous nephrostomy catheter and left double-J ureteral stent in place. Borderline left hydronephrosis. No significant residual left nephrolithiasis. 2. Tiny nonobstructive right renal calculi. 3. Other imaging findings of potential  clinical significance: Coronary atherosclerosis. Prominent stool throughout the colon favors constipation. Scattered sigmoid colon diverticula. Lower lumbar spondylosis and degenerative disc disease causing multilevel impingement. 4. Aortic atherosclerosis.  Aortic Atherosclerosis (ICD10-I70.0).   Electronically Signed By: Gaylyn Rong M.D. On: 08/27/2020 08:04   Assessment & Plan:    1. Renal calculus, left -nephrostomy tube removed today. RTC 1 week for stent removal   Return in about 1 week (around 09/15/2020) for stent removal.  Wilkie Aye, MD  [facility]

## 2020-09-09 NOTE — Patient Instructions (Signed)

## 2020-09-17 ENCOUNTER — Other Ambulatory Visit: Payer: Medicare HMO | Admitting: Urology

## 2020-09-17 ENCOUNTER — Encounter: Payer: Self-pay | Admitting: Urology

## 2020-09-17 ENCOUNTER — Ambulatory Visit (INDEPENDENT_AMBULATORY_CARE_PROVIDER_SITE_OTHER): Payer: Medicare HMO | Admitting: Urology

## 2020-09-17 ENCOUNTER — Other Ambulatory Visit: Payer: Self-pay

## 2020-09-17 VITALS — BP 116/71 | HR 80 | Temp 98.5°F | Ht 60.0 in | Wt 130.1 lb

## 2020-09-17 DIAGNOSIS — N200 Calculus of kidney: Secondary | ICD-10-CM

## 2020-09-17 LAB — URINALYSIS, ROUTINE W REFLEX MICROSCOPIC
Bilirubin, UA: NEGATIVE
Glucose, UA: NEGATIVE
Ketones, UA: NEGATIVE
Nitrite, UA: NEGATIVE
Specific Gravity, UA: 1.02 (ref 1.005–1.030)
Urobilinogen, Ur: 0.2 mg/dL (ref 0.2–1.0)
pH, UA: 5 (ref 5.0–7.5)

## 2020-09-17 LAB — MICROSCOPIC EXAMINATION

## 2020-09-17 MED ORDER — CIPROFLOXACIN HCL 500 MG PO TABS
500.0000 mg | ORAL_TABLET | Freq: Once | ORAL | Status: AC
  Administered 2020-09-17: 500 mg via ORAL

## 2020-09-17 NOTE — Patient Instructions (Signed)

## 2020-09-17 NOTE — Progress Notes (Signed)
   09/17/20  CC: Nephrolithiasis  HPI: Ms Mcmillen is a 84yo here for ureteral stent removal Blood pressure 116/71, pulse 80, temperature 98.5 F (36.9 C), height 5' (1.524 m), weight 130 lb 1.1 oz (59 kg). NED. A&Ox3.   No respiratory distress   Abd soft, NT, ND Normal external genitalia with patent urethral meatus  Cystoscopy Procedure Note  Patient identification was confirmed, informed consent was obtained, and patient was prepped using Betadine solution.  Lidocaine jelly was administered per urethral meatus.    Procedure: - Flexible cystoscope introduced, without any difficulty.   - Thorough search of the bladder revealed:    normal urethral meatus    normal urothelium    no stones    no ulcers     no tumors    no urethral polyps    no trabeculation  - Ureteral orifices were normal in position and appearance. -USING A grasper the left ureteral stent was removed intact  Post-Procedure: - Patient tolerated the procedure well  Assessment/ Plan: RTC 1 month with renal US   No follow-ups on file.  Wilkie Aye, MD

## 2020-09-17 NOTE — Progress Notes (Signed)
Urological Symptom Review  Patient is experiencing the following symptoms: Get up at night to urinate Trouble starting stream Blood in urine   Review of Systems  Gastrointestinal (upper)  : Vomiting  Gastrointestinal (lower) : Negative for lower GI symptoms  Constitutional : Weight loss  Skin: Negative for skin symptoms  Eyes: Negative for eye symptoms  Ear/Nose/Throat : Negative for Ear/Nose/Throat symptoms  Hematologic/Lymphatic: Negative for Hematologic/Lymphatic symptoms  Cardiovascular : Negative for cardiovascular symptoms  Respiratory : Negative for respiratory symptoms  Endocrine: Negative for endocrine symptoms  Musculoskeletal: Back pain  Neurological: Headaches  Psychologic: Depression Anxiety

## 2020-09-29 DIAGNOSIS — Z6824 Body mass index (BMI) 24.0-24.9, adult: Secondary | ICD-10-CM | POA: Diagnosis not present

## 2020-09-29 DIAGNOSIS — M5030 Other cervical disc degeneration, unspecified cervical region: Secondary | ICD-10-CM | POA: Diagnosis not present

## 2020-09-29 DIAGNOSIS — N183 Chronic kidney disease, stage 3 unspecified: Secondary | ICD-10-CM | POA: Diagnosis not present

## 2020-09-29 DIAGNOSIS — M5020 Other cervical disc displacement, unspecified cervical region: Secondary | ICD-10-CM | POA: Diagnosis not present

## 2020-09-29 DIAGNOSIS — I129 Hypertensive chronic kidney disease with stage 1 through stage 4 chronic kidney disease, or unspecified chronic kidney disease: Secondary | ICD-10-CM | POA: Diagnosis not present

## 2020-10-15 ENCOUNTER — Other Ambulatory Visit (HOSPITAL_COMMUNITY): Payer: Self-pay | Admitting: Physician Assistant

## 2020-10-15 ENCOUNTER — Other Ambulatory Visit: Payer: Self-pay | Admitting: Physician Assistant

## 2020-10-15 DIAGNOSIS — M5030 Other cervical disc degeneration, unspecified cervical region: Secondary | ICD-10-CM

## 2020-10-29 ENCOUNTER — Other Ambulatory Visit: Payer: Self-pay

## 2020-10-29 ENCOUNTER — Ambulatory Visit (HOSPITAL_COMMUNITY)
Admission: RE | Admit: 2020-10-29 | Discharge: 2020-10-29 | Disposition: A | Discharge status: Home / Self Care | Payer: Medicare HMO | Source: Ambulatory Visit | Attending: Physician Assistant | Admitting: Physician Assistant

## 2020-10-29 DIAGNOSIS — M542 Cervicalgia: Secondary | ICD-10-CM | POA: Diagnosis not present

## 2020-10-29 DIAGNOSIS — M5030 Other cervical disc degeneration, unspecified cervical region: Secondary | ICD-10-CM | POA: Diagnosis not present

## 2020-11-03 DIAGNOSIS — M130 Polyarthritis, unspecified: Secondary | ICD-10-CM | POA: Diagnosis not present

## 2020-11-03 DIAGNOSIS — E559 Vitamin D deficiency, unspecified: Secondary | ICD-10-CM | POA: Diagnosis not present

## 2020-11-03 DIAGNOSIS — I10 Essential (primary) hypertension: Secondary | ICD-10-CM | POA: Diagnosis not present

## 2020-11-03 DIAGNOSIS — G603 Idiopathic progressive neuropathy: Secondary | ICD-10-CM | POA: Diagnosis not present

## 2020-11-03 DIAGNOSIS — E039 Hypothyroidism, unspecified: Secondary | ICD-10-CM | POA: Diagnosis not present

## 2020-11-05 ENCOUNTER — Ambulatory Visit: Payer: Medicare HMO | Admitting: Urology

## 2020-11-07 DIAGNOSIS — M542 Cervicalgia: Secondary | ICD-10-CM | POA: Diagnosis not present

## 2020-11-10 ENCOUNTER — Ambulatory Visit (HOSPITAL_COMMUNITY): Payer: Medicare HMO

## 2020-11-13 ENCOUNTER — Ambulatory Visit (HOSPITAL_COMMUNITY)
Admission: RE | Admit: 2020-11-13 | Discharge: 2020-11-13 | Disposition: A | Discharge status: Home / Self Care | Payer: Medicare HMO | Source: Ambulatory Visit | Attending: Urology | Admitting: Urology

## 2020-11-13 ENCOUNTER — Other Ambulatory Visit: Payer: Self-pay

## 2020-11-13 DIAGNOSIS — N200 Calculus of kidney: Secondary | ICD-10-CM | POA: Diagnosis not present

## 2020-11-13 DIAGNOSIS — N1330 Unspecified hydronephrosis: Secondary | ICD-10-CM | POA: Diagnosis not present

## 2020-11-13 DIAGNOSIS — N281 Cyst of kidney, acquired: Secondary | ICD-10-CM | POA: Diagnosis not present

## 2020-11-18 ENCOUNTER — Ambulatory Visit (INDEPENDENT_AMBULATORY_CARE_PROVIDER_SITE_OTHER): Payer: Medicare HMO | Admitting: Urology

## 2020-11-18 ENCOUNTER — Encounter: Payer: Self-pay | Admitting: Urology

## 2020-11-18 ENCOUNTER — Other Ambulatory Visit: Payer: Self-pay

## 2020-11-18 VITALS — BP 149/77 | HR 73 | Temp 98.5°F | Ht 60.0 in | Wt 124.0 lb

## 2020-11-18 DIAGNOSIS — N200 Calculus of kidney: Secondary | ICD-10-CM

## 2020-11-18 LAB — URINALYSIS, ROUTINE W REFLEX MICROSCOPIC
Bilirubin, UA: NEGATIVE
Glucose, UA: NEGATIVE
Ketones, UA: NEGATIVE
Nitrite, UA: NEGATIVE
Protein,UA: NEGATIVE
RBC, UA: NEGATIVE
Specific Gravity, UA: 1.02 (ref 1.005–1.030)
Urobilinogen, Ur: 0.2 mg/dL (ref 0.2–1.0)
pH, UA: 5.5 (ref 5.0–7.5)

## 2020-11-18 LAB — MICROSCOPIC EXAMINATION
Epithelial Cells (non renal): >10 /hpf — AB (ref 0–10)
RBC, Urine: NONE SEEN /hpf (ref 0–2)
Renal Epithel, UA: NONE SEEN /hpf

## 2020-11-18 NOTE — Patient Instructions (Signed)

## 2020-11-18 NOTE — Progress Notes (Signed)
11/18/2020 3:22 PM   Claire Barnett 13-Sep-1935 [id number]  Referring provider: Elfredia Nevins, MD [address] [place],  [place] [address]  Nephrolithiasis  HPI: Claire Barnett is a 84yo here for followup for nephrolithiasis. Renal US from 11/16/2019 showed no left renal calculi and a 52mm right renal Calculus. No flank pain. No LUTS. No other complaints.    PMH: Past Medical History:  Diagnosis Date  . Anxiety   . History of kidney stones   . Hypertension   . S/P colonoscopy August 2007   Rare sigmoid diverticulosis.  Otherwise, no polyps, inflammatory    Surgical History: Past Surgical History:  Procedure Laterality Date  . ABDOMINAL HYSTERECTOMY    . BREAST LUMPECTOMY Right    benign  . CATARACT EXTRACTION W/PHACO Right 02/01/2013   Procedure: CATARACT EXTRACTION PHACO AND INTRAOCULAR LENS PLACEMENT (IOC);  Surgeon: Gemma Payor, MD;  Location: [facility];  Service: Ophthalmology;  Laterality: Right;  CDE:25.80  . COLONOSCOPY  05/12/2011   Procedure: COLONOSCOPY;  Surgeon: Arlyce Harman, MD;  [facility];  Service: Endoscopy;  Laterality: N/A;  10:30AM  . CYSTOSCOPY W/ STONE MANIPULATION     x3  . IR NEPHROSTOMY PLACEMENT LEFT  08/15/2020  . kidney stones     X 4 procedures  . NEPHROLITHOTOMY Left 08/18/2020   Procedure: NEPHROLITHOTOMY PERCUTANEOUS.;  Surgeon: Malen Gauze, MD;  Location: [facility];  Service: Urology;  Laterality: Left;    Home Medications:  Allergies as of 11/18/2020   No Known Allergies     Medication List       Accurate as of November 18, 2020  3:22 PM. If you have any questions, ask your nurse or doctor.        ALPRAZolam 0.5 MG tablet Commonly known as: XANAX Take 0.5-1 mg by mouth 3 (three) times daily.   cholecalciferol 1000 units tablet Commonly known as: VITAMIN D Take 1,000 Units by mouth daily.   DULoxetine 30 MG capsule Commonly known as: CYMBALTA Take 60 mg by mouth daily.   gabapentin 100 MG capsule Commonly  known as: NEURONTIN Take 100 mg by mouth 3 (three) times daily.   hydrochlorothiazide 25 MG tablet Commonly known as: HYDRODIURIL Take 25 mg by mouth every morning.   naproxen 375 MG tablet Commonly known as: NAPROSYN Take 375 mg by mouth 2 (two) times daily.   oxyCODONE-acetaminophen 5-325 MG tablet Commonly known as: PERCOCET/ROXICET oxycodone-acetaminophen 5 mg-325 mg tablet   Xarelto 20 MG Tabs tablet Generic drug: rivaroxaban SMARTSIG:1 Tablet(s) By Mouth Every Evening       Allergies: No Known Allergies  Family History: Family History  Problem Relation Age of Onset  . Colon cancer Sister        diagnosed late 20s.   . Colon cancer Mother        diagnosed in mid to late 22s. Deceased age 104    Social History:  reports that she has never smoked. She has never used smokeless tobacco. She reports that she does not drink alcohol and does not use drugs.  ROS: All other review of systems were reviewed and are negative except what is noted above in HPI  Physical Exam: BP (!) 149/77   Pulse 73   Temp 98.5 F (36.9 C)   Ht 5' (1.524 m)   Wt 124 lb (56.2 kg)   BMI 24.22 kg/m   Constitutional:  Alert and oriented, No acute distress. HEENT: [place] AT, moist mucus membranes.  Trachea midline,  no masses. Cardiovascular: No clubbing, cyanosis, or edema. Respiratory: Normal respiratory effort, no increased work of breathing. GI: Abdomen is soft, nontender, nondistended, no abdominal masses GU: No CVA tenderness.  Lymph: No cervical or inguinal lymphadenopathy. Skin: No rashes, bruises or suspicious lesions. Neurologic: Grossly intact, no focal deficits, moving all 4 extremities. Psychiatric: Normal mood and affect.  Laboratory Data: Lab Results  Component Value Date   WBC 8.6 08/19/2020   HGB 9.6 (L) 08/19/2020   HCT 30.1 (L) 08/19/2020   MCV 94.1 08/19/2020   PLT 190 08/19/2020    Lab Results  Component Value Date   CREATININE 0.87 08/19/2020    No results  found for: PSA  No results found for: TESTOSTERONE  No results found for: HGBA1C  Urinalysis    Component Value Date/Time   COLORURINE AMBER (A) 09/07/2013 2045   APPEARANCEUR Hazy (A) 09/17/2020 1326   LABSPEC 1.025 09/07/2013 2045   PHURINE 6.5 09/07/2013 2045   GLUCOSEU Negative 09/17/2020 1326   HGBUR LARGE (A) 09/07/2013 2045   BILIRUBINUR Negative 09/17/2020 1326   KETONESUR 15 (A) 09/07/2013 2045   PROTEINUR 1+ (A) 09/17/2020 1326   PROTEINUR >300 (A) 09/07/2013 2045   UROBILINOGEN 0.2 09/07/2013 2045   NITRITE Negative 09/17/2020 1326   NITRITE NEGATIVE 09/07/2013 2045   LEUKOCYTESUR 1+ (A) 09/17/2020 1326    Lab Results  Component Value Date   LABMICR See below: 09/17/2020   WBCUA 6-10 (A) 09/17/2020   LABEPIT 0-10 09/17/2020   BACTERIA Moderate (A) 09/17/2020    Pertinent Imaging: Renal US 11/15/2020: Images reviewed and discussed with the patient No results found for this or any previous visit.  No results found for this or any previous visit.  No results found for this or any previous visit.  No results found for this or any previous visit.  Results for orders placed during the hospital encounter of 11/13/20  Ultrasound renal complete  Narrative CLINICAL DATA:  History of nephrolithiasis.  EXAM: RENAL / URINARY TRACT ULTRASOUND COMPLETE  COMPARISON:  CT scan August 26, 2020  FINDINGS: Right Kidney:  Renal measurements: 9.6 x 4.1 x 4.3 cm = volume: 86.3 mL. There are 2 cyst in the right kidney measuring 10 and 12 mm. There is an apparent 4.5 mm stone in the upper pole the right kidney. No hydronephrosis.  Left Kidney:  Renal measurements: 10.5 x 6.3 x 5.4 cm = volume: 185 mL. Mild hydronephrosis which persists on postvoid imaging. There is a 3.3 cm cyst off the lower pole the left kidney.  Bladder:  Appears normal for degree of bladder distention.  Other:  None.  IMPRESSION: 1. Mild hydronephrosis on the left, age  indeterminate. 2. Bilateral renal cysts as above. 3. Apparent 4.5 mm nonobstructive stone in the upper pole of the right kidney.   Electronically Signed By: Gerome Sam III M.D On: 11/15/2020 09:14  No results found for this or any previous visit.  No results found for this or any previous visit.  Results for orders placed during the hospital encounter of 08/26/20  CT RENAL STONE STUDY  Narrative CLINICAL DATA:  Intermittent left flank pain. Gross hematuria. Prior percutaneous nephrolithotomy on 08/18/2020  EXAM: CT ABDOMEN AND PELVIS WITHOUT CONTRAST  TECHNIQUE: Multidetector CT imaging of the abdomen and pelvis was performed following the standard protocol without IV contrast.  COMPARISON:  07/16/2020  FINDINGS: Lower chest: Bandlike scarring extending sagittally in the left lower lobe. Mild scarring medially in the right middle lobe. Left anterior  descending, right, and circumflex coronary artery atherosclerosis.  Hepatobiliary: Unremarkable  Pancreas: Unremarkable  Spleen: Unremarkable  Adrenals/Urinary Tract: Mild fullness of the left adrenal gland without discrete mass. There are about three nonobstructive right renal calculi in the 1-2 mm size range. Small hypodense lesions in the right mid kidney are again observed, the larger measuring about 1.2 cm in diameter, most likely a cyst.  Interval removal of the large left staghorn calculus. Left percutaneous nephrostomy catheter is in place along with a small amount of gas in the collecting system. There is also a left double-J ureteral stent in place. Borderline left hydronephrosis. No substantial hydroureter. Small amount of gas in the urinary bladder. No significant residual left nephrolithiasis. No ureteral stones on the left along side the stent.  Exophytic 3.1 by 2.7 cm fluid density left kidney lower pole lesion favoring cyst.  Stomach/Bowel: Scattered sigmoid colon diverticula. Prominent  stool throughout the colon favors constipation.  Vascular/Lymphatic: Aortoiliac atherosclerotic vascular disease.  Reproductive: Uterus absent. Adnexa unremarkable.  Other: No supplemental non-categorized findings.  Musculoskeletal: Lower lumbar spondylosis and degenerative disc disease with grade 1 anterolisthesis at L4-5 and L5-S1 and suspected multilevel impingement.  IMPRESSION: 1. Interval removal of the large left staghorn calculus with a left percutaneous nephrostomy catheter and left double-J ureteral stent in place. Borderline left hydronephrosis. No significant residual left nephrolithiasis. 2. Tiny nonobstructive right renal calculi. 3. Other imaging findings of potential clinical significance: Coronary atherosclerosis. Prominent stool throughout the colon favors constipation. Scattered sigmoid colon diverticula. Lower lumbar spondylosis and degenerative disc disease causing multilevel impingement. 4. Aortic atherosclerosis.  Aortic Atherosclerosis (ICD10-I70.0).   Electronically Signed By: Gaylyn Rong M.D. On: 08/27/2020 08:04   Assessment & Plan:    1. Renal calculus, left -RTC 6 months with renal US - Urinalysis, Routine w reflex microscopic   No follow-ups on file.  Wilkie Aye, MD  [facility]

## 2020-11-18 NOTE — Progress Notes (Signed)
Urological Symptom Review  Patient is experiencing the following symptoms: Get up at night to urinate  Kidney stones   Review of Systems  Gastrointestinal (upper)  : Negative for upper GI symptoms  Gastrointestinal (lower) : Negative for lower GI symptoms  Constitutional : Weight loss Fatigue  Skin: Negative for skin symptoms  Eyes: Negative for eye symptoms  Ear/Nose/Throat : Negative for Ear/Nose/Throat symptoms  Hematologic/Lymphatic: Easy bruising  Cardiovascular : Negative for cardiovascular symptoms  Respiratory : Negative for respiratory symptoms  Endocrine: Negative for endocrine symptoms  Musculoskeletal: Negative for musculoskeletal symptoms  Neurological: Headaches  Psychologic: Depression Anxiety

## 2020-11-26 DIAGNOSIS — E7849 Other hyperlipidemia: Secondary | ICD-10-CM | POA: Diagnosis not present

## 2020-11-26 DIAGNOSIS — N183 Chronic kidney disease, stage 3 unspecified: Secondary | ICD-10-CM | POA: Diagnosis not present

## 2020-12-02 ENCOUNTER — Encounter (HOSPITAL_COMMUNITY): Payer: Self-pay

## 2020-12-02 ENCOUNTER — Other Ambulatory Visit: Payer: Self-pay

## 2020-12-02 ENCOUNTER — Ambulatory Visit (HOSPITAL_COMMUNITY): Payer: Medicare HMO | Attending: Orthopedic Surgery

## 2020-12-02 DIAGNOSIS — M542 Cervicalgia: Secondary | ICD-10-CM

## 2020-12-02 DIAGNOSIS — M5382 Other specified dorsopathies, cervical region: Secondary | ICD-10-CM | POA: Diagnosis not present

## 2020-12-02 NOTE — Patient Instructions (Signed)
Access Code: [email or web address] URL: [email or web address] Date: 12/02/2020 Prepared by: Shary Decamp  Exercises Upper Cervical Extension SNAG with Strap - 1 x daily - 7 x weekly - 3 sets - 5-10 reps - 3-5 sec hold Seated Assisted Cervical Rotation with Towel - 1 x daily - 7 x weekly - 3 sets - 10 reps

## 2020-12-02 NOTE — Therapy (Signed)
[facility] [address] [place], [place], [address] Phone: [phone]   Fax:  [phone]  Physical Therapy Evaluation  Patient Details  Name: Claire Barnett MRN: [id number] Date of Birth: 02-02-1936 Referring Provider (PT): Dr Venita Lick   Encounter Date: 12/02/2020   PT End of Session - 12/02/20 0929    Visit Number 1    Number of Visits 3    Date for PT Re-Evaluation 12/23/20    Authorization Type [employer] Medicare HMO, auth required via [employer], no V.L.    PT Start Time 0932    PT Stop Time 1011    PT Time Calculation (min) 39 min    Activity Tolerance Patient tolerated treatment well    Behavior During Therapy [facility] for tasks assessed/performed           Past Medical History:  Diagnosis Date  . Anxiety   . History of kidney stones   . Hypertension   . S/P colonoscopy August 2007   Rare sigmoid diverticulosis.  Otherwise, no polyps, inflammatory    Past Surgical History:  Procedure Laterality Date  . ABDOMINAL HYSTERECTOMY    . BREAST LUMPECTOMY Right    benign  . CATARACT EXTRACTION W/PHACO Right 02/01/2013   Procedure: CATARACT EXTRACTION PHACO AND INTRAOCULAR LENS PLACEMENT (IOC);  Surgeon: Gemma Payor, MD;  Location: [facility];  Service: Ophthalmology;  Laterality: Right;  CDE:25.80  . COLONOSCOPY  05/12/2011   Procedure: COLONOSCOPY;  Surgeon: Arlyce Harman, MD;  [facility];  Service: Endoscopy;  Laterality: N/A;  10:30AM  . CYSTOSCOPY W/ STONE MANIPULATION     x3  . IR NEPHROSTOMY PLACEMENT LEFT  08/15/2020  . kidney stones     X 4 procedures  . NEPHROLITHOTOMY Left 08/18/2020   Procedure: NEPHROLITHOTOMY PERCUTANEOUS.;  Surgeon: Malen Gauze, MD;  Location: [facility];  Service: Urology;  Laterality: Left;    There were no vitals filed for this visit.    Subjective Assessment - 12/02/20 0932    Subjective Pt reports hx of neck pain since December when she fell backwards and struck her head.  Pt  reports x-ray was negative for fracture or dislocation but reveals multi-level degeneration. Denies referred pain in shoulders/arms and no parasthesias.    Currently in Pain? No/denies    Pain Score 0-No pain    Pain Location Neck    Pain Descriptors / Indicators Tiring    Pain Onset More than a month ago    Pain Frequency Occasional    Aggravating Factors  turning head/neck when driving              [facility] PT Assessment - 12/02/20 0001      Assessment   Medical Diagnosis Neck Pain    Referring Provider (PT) Dr Venita Lick      Balance Screen   Has the patient fallen in the past 6 months Yes    How many times? 1    Has the patient had a decrease in activity level because of a fear of falling?  No    Is the patient reluctant to leave their home because of a fear of falling?  No      Home Environment   Living Environment Private residence      Prior Function   Leisure walking      Observation/Other Assessments   Focus on Therapeutic Outcomes (FOTO)  61.7% function      ROM / Strength   AROM /  PROM / Strength AROM      AROM   AROM Assessment Site Cervical    Cervical Flexion WNL    Cervical Extension 10% limited    Cervical - Right Side Bend 25% limited    Cervical - Left Side Bend 25% limited    Cervical - Right Rotation 25% limited    Cervical - Left Rotation 25% limited      Palpation   Palpation comment no TTP or spasm appreciated                      Objective measurements completed on examination: See above findings.       [facility] Adult PT Treatment/Exercise - 12/02/20 0001      Exercises   Exercises Neck      Neck Exercises: Seated   Other Seated Exercise cervical extension and side bend SNAG                  PT Education - 12/02/20 0957    Education Details education on c-spine and tx rationale and HEP initiation    Person(s) Educated Patient    Methods Explanation;Handout    Comprehension Verbalized understanding             PT Short Term Goals - 12/02/20 1005      PT SHORT TERM GOAL #1   Title Pt will demo independent with HEP for c-spine ROM    Time 1    Period Weeks    Status New    Target Date 12/09/20      PT SHORT TERM GOAL #2   Title Pt will report no increase in pain with ROM/stretching activities to improve cervical spine ROM    Baseline no pain, just fatigue and limited ROM in c-spine    Time 1    Period Weeks    Target Date 12/09/20             PT Long Term Goals - 12/02/20 1006      PT LONG TERM GOAL #1   Title Patient will demo right/left cervical rotation to 10% limited to improve comfort and flexibility for driving    Baseline 66% limitation in c-spine rotation/side bending    Time 3    Period Weeks    Status New    Target Date 12/23/20      PT LONG TERM GOAL #2   Title Patient will improve on FOTO score to meet predicted outcomes to improve functional independence    Baseline 61.7% function    Time 3    Period Weeks    Status New    Target Date 12/23/20                  Plan - 12/02/20 1003    Clinical Impression Statement Patient is a 85 yo lady presenting to physical therapy with c/o improving neck pain. She presents with pain limited deficits in cervical ROM, endurance, postural impairments, spinal mobility and functional mobility with ADL. She is having to modify and restrict ADL as indicated by FOTO score as well as subjective information and objective measures which is affecting overall participation. Patient will benefit from skilled physical therapy in order to improve function and reduce impairment.    Personal Factors and Comorbidities Age;Time since onset of injury/illness/exacerbation    Examination-Activity Limitations Bend;Lift;Reach Overhead    Examination-Participation Restrictions Driving;Meal Prep    Stability/Clinical Decision Making Stable/Uncomplicated    Clinical Decision Making Low  Rehab Potential Excellent    PT Frequency 1x /  week    PT Duration 3 weeks    PT Treatment/Interventions ADLs/Self Care Home Management;Cryotherapy;Electrical Stimulation;Ultrasound;Traction;Moist Heat;Gait training;Stair training;Functional mobility training;Therapeutic activities;Therapeutic exercise;Balance training;Patient/family education;Manual techniques;Passive range of motion;Taping;Dry needling;Spinal Manipulations;Joint Manipulations    PT Next Visit Plan re-check ROM, review SNAG, chin retractions    PT Home Exercise Plan extension and side bend SNAG with towel    Consulted and Agree with Plan of Care Patient           Patient will benefit from skilled therapeutic intervention in order to improve the following deficits and impairments:  Decreased activity tolerance,Decreased range of motion,Hypomobility,Impaired flexibility,Postural dysfunction  Visit Diagnosis: Neck pain  Impaired range of motion of cervical spine     Problem List Patient Active Problem List   Diagnosis Date Noted  . Renal calculus 08/18/2020  . UTI (lower urinary tract infection) 09/07/2013  . Encephalopathy acute 09/07/2013  . Influenza 09/07/2013  . HTN (hypertension) 09/07/2013  . Anxiety 09/07/2013  . Family hx of colon cancer 04/15/2011   10:10 AM, 12/02/20 M. Shary Decamp, PT, DPT Physical Therapist- [place] Office Number: [phone]  [facility] [address] [place], [place], [address] Phone: [phone]   Fax:  [phone]  Name: Claire Barnett MRN: [id number] Date of Birth: 1936-07-02

## 2020-12-05 DIAGNOSIS — I700 Atherosclerosis of aorta: Secondary | ICD-10-CM | POA: Diagnosis not present

## 2020-12-05 DIAGNOSIS — Z6826 Body mass index (BMI) 26.0-26.9, adult: Secondary | ICD-10-CM | POA: Diagnosis not present

## 2020-12-05 DIAGNOSIS — E063 Autoimmune thyroiditis: Secondary | ICD-10-CM | POA: Diagnosis not present

## 2020-12-05 DIAGNOSIS — I10 Essential (primary) hypertension: Secondary | ICD-10-CM | POA: Diagnosis not present

## 2020-12-08 ENCOUNTER — Encounter (HOSPITAL_COMMUNITY): Payer: Self-pay

## 2020-12-08 ENCOUNTER — Ambulatory Visit (HOSPITAL_COMMUNITY): Payer: Medicare HMO

## 2020-12-08 NOTE — Therapy (Signed)
[place], [place], [address] Phone: [phone]   Fax:  [phone]  Patient Details  Name: Claire Barnett MRN: [id number] Date of Birth: 27-Jan-1936 Referring Provider:  No ref. provider found  Encounter Date: 12/08/2020  PHYSICAL THERAPY DISCHARGE SUMMARY  Visits from Start of Care: 1  Current functional level related to goals / functional outcomes: Pt called to cancel remaining visits reporting she is feeling better   Remaining deficits: Unable to assess   Education / Equipment: Provided with preliminary HEP Plan: Patient agrees to discharge.  Patient goals were partially met. Patient is being discharged due to being pleased with the current functional level.  ?????      11:52 AM, 12/08/20 M. Sherlyn Lees, PT, DPT Physical Therapist- [place] Office Number: [phone]  [facility] [address] [place], [place], [address] Phone: [phone]   Fax:  [phone]

## 2020-12-27 DIAGNOSIS — N183 Chronic kidney disease, stage 3 unspecified: Secondary | ICD-10-CM | POA: Diagnosis not present

## 2020-12-27 DIAGNOSIS — E7849 Other hyperlipidemia: Secondary | ICD-10-CM | POA: Diagnosis not present

## 2021-02-24 DIAGNOSIS — Z1389 Encounter for screening for other disorder: Secondary | ICD-10-CM | POA: Diagnosis not present

## 2021-02-24 DIAGNOSIS — I10 Essential (primary) hypertension: Secondary | ICD-10-CM | POA: Diagnosis not present

## 2021-02-24 DIAGNOSIS — E663 Overweight: Secondary | ICD-10-CM | POA: Diagnosis not present

## 2021-02-24 DIAGNOSIS — E063 Autoimmune thyroiditis: Secondary | ICD-10-CM | POA: Diagnosis not present

## 2021-02-24 DIAGNOSIS — E7849 Other hyperlipidemia: Secondary | ICD-10-CM | POA: Diagnosis not present

## 2021-02-24 DIAGNOSIS — I700 Atherosclerosis of aorta: Secondary | ICD-10-CM | POA: Diagnosis not present

## 2021-02-24 DIAGNOSIS — Z0001 Encounter for general adult medical examination with abnormal findings: Secondary | ICD-10-CM | POA: Diagnosis not present

## 2021-02-24 DIAGNOSIS — F419 Anxiety disorder, unspecified: Secondary | ICD-10-CM | POA: Diagnosis not present

## 2021-02-24 DIAGNOSIS — Z6826 Body mass index (BMI) 26.0-26.9, adult: Secondary | ICD-10-CM | POA: Diagnosis not present

## 2021-02-24 DIAGNOSIS — Z1331 Encounter for screening for depression: Secondary | ICD-10-CM | POA: Diagnosis not present

## 2021-03-12 DIAGNOSIS — E663 Overweight: Secondary | ICD-10-CM | POA: Diagnosis not present

## 2021-03-12 DIAGNOSIS — B029 Zoster without complications: Secondary | ICD-10-CM | POA: Diagnosis not present

## 2021-03-12 DIAGNOSIS — Z6827 Body mass index (BMI) 27.0-27.9, adult: Secondary | ICD-10-CM | POA: Diagnosis not present

## 2021-04-15 ENCOUNTER — Other Ambulatory Visit (HOSPITAL_COMMUNITY): Payer: Self-pay | Admitting: Physician Assistant

## 2021-04-15 ENCOUNTER — Ambulatory Visit (HOSPITAL_COMMUNITY)
Admission: RE | Admit: 2021-04-15 | Discharge: 2021-04-15 | Disposition: A | Discharge status: Home / Self Care | Payer: Medicare HMO | Source: Ambulatory Visit | Attending: Physician Assistant | Admitting: Physician Assistant

## 2021-04-15 ENCOUNTER — Other Ambulatory Visit: Payer: Self-pay

## 2021-04-15 DIAGNOSIS — M25551 Pain in right hip: Secondary | ICD-10-CM | POA: Insufficient documentation

## 2021-04-15 DIAGNOSIS — L84 Corns and callosities: Secondary | ICD-10-CM | POA: Diagnosis not present

## 2021-04-15 DIAGNOSIS — Z6827 Body mass index (BMI) 27.0-27.9, adult: Secondary | ICD-10-CM | POA: Diagnosis not present

## 2021-04-15 DIAGNOSIS — E663 Overweight: Secondary | ICD-10-CM | POA: Diagnosis not present

## 2021-04-20 DIAGNOSIS — I10 Essential (primary) hypertension: Secondary | ICD-10-CM | POA: Diagnosis not present

## 2021-04-20 DIAGNOSIS — G603 Idiopathic progressive neuropathy: Secondary | ICD-10-CM | POA: Diagnosis not present

## 2021-04-20 DIAGNOSIS — G5711 Meralgia paresthetica, right lower limb: Secondary | ICD-10-CM | POA: Diagnosis not present

## 2021-04-20 DIAGNOSIS — E039 Hypothyroidism, unspecified: Secondary | ICD-10-CM | POA: Diagnosis not present

## 2021-04-20 DIAGNOSIS — M130 Polyarthritis, unspecified: Secondary | ICD-10-CM | POA: Diagnosis not present

## 2021-04-20 DIAGNOSIS — E559 Vitamin D deficiency, unspecified: Secondary | ICD-10-CM | POA: Diagnosis not present

## 2021-05-15 ENCOUNTER — Ambulatory Visit (HOSPITAL_COMMUNITY): Admission: RE | Admit: 2021-05-15 | Payer: Medicare HMO | Source: Ambulatory Visit

## 2021-05-22 ENCOUNTER — Encounter: Payer: Self-pay | Admitting: Urology

## 2021-05-22 ENCOUNTER — Other Ambulatory Visit: Payer: Self-pay

## 2021-05-22 ENCOUNTER — Ambulatory Visit: Payer: Medicare HMO | Admitting: Urology

## 2021-05-22 VITALS — BP 130/75 | HR 98

## 2021-05-22 DIAGNOSIS — N200 Calculus of kidney: Secondary | ICD-10-CM | POA: Diagnosis not present

## 2021-05-22 LAB — MICROSCOPIC EXAMINATION
RBC, Urine: NONE SEEN /hpf (ref 0–2)
Renal Epithel, UA: NONE SEEN /hpf

## 2021-05-22 LAB — URINALYSIS, ROUTINE W REFLEX MICROSCOPIC
Bilirubin, UA: NEGATIVE
Glucose, UA: NEGATIVE
Ketones, UA: NEGATIVE
Nitrite, UA: NEGATIVE
Protein,UA: NEGATIVE
RBC, UA: NEGATIVE
Specific Gravity, UA: >1.03 — ABNORMAL HIGH (ref 1.005–1.030)
Urobilinogen, Ur: 1 mg/dL (ref 0.2–1.0)
pH, UA: 5 (ref 5.0–7.5)

## 2021-05-22 NOTE — Progress Notes (Signed)
05/22/2021 12:46 PM   Claire Barnett 1936/08/15 [id number]  Referring provider: Elfredia Nevins, MD [address] [place],  [place] [address]  Followup nephrolithiasis   HPI: [name] is a 85yo here for followup for nephrolithiasis. No recent abdominal imaging. No stone events since last visit. No flank pain. No significant LUTS. No other complaints today   PMH: Past Medical History:  Diagnosis Date   Anxiety    History of kidney stones    Hypertension    S/P colonoscopy August 2007   Rare sigmoid diverticulosis.  Otherwise, no polyps, inflammatory    Surgical History: Past Surgical History:  Procedure Laterality Date   ABDOMINAL HYSTERECTOMY     BREAST LUMPECTOMY Right    benign   CATARACT EXTRACTION W/PHACO Right 02/01/2013   Procedure: CATARACT EXTRACTION PHACO AND INTRAOCULAR LENS PLACEMENT (IOC);  Surgeon: Gemma Payor, MD;  Location: [facility];  Service: Ophthalmology;  Laterality: Right;  CDE:25.80   COLONOSCOPY  05/12/2011   Procedure: COLONOSCOPY;  Surgeon: Arlyce Harman, MD;  Location: [facility];  Service: Endoscopy;  Laterality: N/A;  10:30AM   CYSTOSCOPY W/ STONE MANIPULATION     x3   IR NEPHROSTOMY PLACEMENT LEFT  08/15/2020   kidney stones     X 4 procedures   NEPHROLITHOTOMY Left 08/18/2020   Procedure: NEPHROLITHOTOMY PERCUTANEOUS.;  Surgeon: Malen Gauze, MD;  Location: [facility];  Service: Urology;  Laterality: Left;    Home Medications:  Allergies as of 05/22/2021   No Known Allergies      Medication List        Accurate as of May 22, 2021 12:46 PM. If you have any questions, ask your nurse or doctor.          STOP taking these medications    DULoxetine 30 MG capsule Commonly known as: CYMBALTA Stopped by: Wilkie Aye, MD   naproxen 375 MG tablet Commonly known as: NAPROSYN Stopped by: Wilkie Aye, MD   oxyCODONE-acetaminophen 5-325 MG tablet Commonly known as: PERCOCET/ROXICET Stopped by: Wilkie Aye, MD   Xarelto 20 MG Tabs tablet Generic drug: rivaroxaban Stopped by: Wilkie Aye, MD       TAKE these medications    ALPRAZolam 0.5 MG tablet Commonly known as: XANAX Take 0.5-1 mg by mouth 3 (three) times daily.   cholecalciferol 1000 units tablet Commonly known as: VITAMIN D Take 1,000 Units by mouth daily.   gabapentin 100 MG capsule Commonly known as: NEURONTIN Take 100 mg by mouth 3 (three) times daily.   hydrochlorothiazide 25 MG tablet Commonly known as: HYDRODIURIL Take 25 mg by mouth every morning.        Allergies: No Known Allergies  Family History: Family History  Problem Relation Age of Onset   Colon cancer Sister        diagnosed late 21s.    Colon cancer Mother        diagnosed in mid to late 55s. Deceased age 34    Social History:  reports that she has never smoked. She has never used smokeless tobacco. She reports that she does not drink alcohol and does not use drugs.  ROS: All other review of systems were reviewed and are negative except what is noted above in HPI  Physical Exam: BP 130/75   Pulse 98   Constitutional:  Alert and oriented, No acute distress. HEENT: [place] AT, moist mucus membranes.  Trachea midline, no masses. Cardiovascular: No clubbing, cyanosis, or edema. Respiratory: Normal respiratory effort, no  increased work of breathing. GI: Abdomen is soft, nontender, nondistended, no abdominal masses GU: No CVA tenderness.  Lymph: No cervical or inguinal lymphadenopathy. Skin: No rashes, bruises or suspicious lesions. Neurologic: Grossly intact, no focal deficits, moving all 4 extremities. Psychiatric: Normal mood and affect.  Laboratory Data: Lab Results  Component Value Date   WBC 8.6 08/19/2020   HGB 9.6 (L) 08/19/2020   HCT 30.1 (L) 08/19/2020   MCV 94.1 08/19/2020   PLT 190 08/19/2020    Lab Results  Component Value Date   CREATININE 0.87 08/19/2020    No results found for: PSA  No results found  for: TESTOSTERONE  No results found for: HGBA1C  Urinalysis    Component Value Date/Time   COLORURINE AMBER (A) 09/07/2013 2045   APPEARANCEUR Clear 11/18/2020 1452   LABSPEC 1.025 09/07/2013 2045   PHURINE 6.5 09/07/2013 2045   GLUCOSEU Negative 11/18/2020 1452   HGBUR LARGE (A) 09/07/2013 2045   BILIRUBINUR Negative 11/18/2020 1452   KETONESUR 15 (A) 09/07/2013 2045   PROTEINUR Negative 11/18/2020 1452   PROTEINUR >300 (A) 09/07/2013 2045   UROBILINOGEN 0.2 09/07/2013 2045   NITRITE Negative 11/18/2020 1452   NITRITE NEGATIVE 09/07/2013 2045   LEUKOCYTESUR Trace (A) 11/18/2020 1452    Lab Results  Component Value Date   LABMICR See below: 11/18/2020   WBCUA 6-10 (A) 11/18/2020   LABEPIT >10 (A) 11/18/2020   MUCUS Present 11/18/2020   BACTERIA Few 11/18/2020    Pertinent Imaging:  No results found for this or any previous visit.  No results found for this or any previous visit.  No results found for this or any previous visit.  No results found for this or any previous visit.  Results for orders placed during the hospital encounter of 11/13/20  Ultrasound renal complete  Narrative CLINICAL DATA:  History of nephrolithiasis.  EXAM: RENAL / URINARY TRACT ULTRASOUND COMPLETE  COMPARISON:  CT scan August 26, 2020  FINDINGS: Right Kidney:  Renal measurements: 9.6 x 4.1 x 4.3 cm = volume: 86.3 mL. There are 2 cyst in the right kidney measuring 10 and 12 mm. There is an apparent 4.5 mm stone in the upper pole the right kidney. No hydronephrosis.  Left Kidney:  Renal measurements: 10.5 x 6.3 x 5.4 cm = volume: 185 mL. Mild hydronephrosis which persists on postvoid imaging. There is a 3.3 cm cyst off the lower pole the left kidney.  Bladder:  Appears normal for degree of bladder distention.  Other:  None.  IMPRESSION: 1. Mild hydronephrosis on the left, age indeterminate. 2. Bilateral renal cysts as above. 3. Apparent 4.5 mm nonobstructive  stone in the upper pole of the right kidney.   Electronically Signed By: Gerome Sam III M.D On: 11/15/2020 09:14  No results found for this or any previous visit.  No results found for this or any previous visit.  Results for orders placed during the hospital encounter of 08/26/20  CT RENAL STONE STUDY  Narrative CLINICAL DATA:  Intermittent left flank pain. Gross hematuria. Prior percutaneous nephrolithotomy on 08/18/2020  EXAM: CT ABDOMEN AND PELVIS WITHOUT CONTRAST  TECHNIQUE: Multidetector CT imaging of the abdomen and pelvis was performed following the standard protocol without IV contrast.  COMPARISON:  07/16/2020  FINDINGS: Lower chest: Bandlike scarring extending sagittally in the left lower lobe. Mild scarring medially in the right middle lobe. Left anterior descending, right, and circumflex coronary artery atherosclerosis.  Hepatobiliary: Unremarkable  Pancreas: Unremarkable  Spleen: Unremarkable  Adrenals/Urinary Tract:  Mild fullness of the left adrenal gland without discrete mass. There are about three nonobstructive right renal calculi in the 1-2 mm size range. Small hypodense lesions in the right mid kidney are again observed, the larger measuring about 1.2 cm in diameter, most likely a cyst.  Interval removal of the large left staghorn calculus. Left percutaneous nephrostomy catheter is in place along with a small amount of gas in the collecting system. There is also a left double-J ureteral stent in place. Borderline left hydronephrosis. No substantial hydroureter. Small amount of gas in the urinary bladder. No significant residual left nephrolithiasis. No ureteral stones on the left along side the stent.  Exophytic 3.1 by 2.7 cm fluid density left kidney lower pole lesion favoring cyst.  Stomach/Bowel: Scattered sigmoid colon diverticula. Prominent stool throughout the colon favors constipation.  Vascular/Lymphatic: Aortoiliac  atherosclerotic vascular disease.  Reproductive: Uterus absent. Adnexa unremarkable.  Other: No supplemental non-categorized findings.  Musculoskeletal: Lower lumbar spondylosis and degenerative disc disease with grade 1 anterolisthesis at L4-5 and L5-S1 and suspected multilevel impingement.  IMPRESSION: 1. Interval removal of the large left staghorn calculus with a left percutaneous nephrostomy catheter and left double-J ureteral stent in place. Borderline left hydronephrosis. No significant residual left nephrolithiasis. 2. Tiny nonobstructive right renal calculi. 3. Other imaging findings of potential clinical significance: Coronary atherosclerosis. Prominent stool throughout the colon favors constipation. Scattered sigmoid colon diverticula. Lower lumbar spondylosis and degenerative disc disease causing multilevel impingement. 4. Aortic atherosclerosis.  Aortic Atherosclerosis (ICD10-I70.0).   Electronically Signed By: Gaylyn Rong M.D. On: 08/27/2020 08:04   Assessment & Plan:    1. Renal calculus, left Renal US , will call with results. If [place] negative, I will see her back in 1 year with a renal US and KUB - Urinalysis, Routine w reflex microscopic   No follow-ups on file.  Wilkie Aye, MD  [facility] [place]

## 2021-05-22 NOTE — Patient Instructions (Signed)
Dietary Guidelines to Help Prevent Kidney Stones Kidney stones are deposits of minerals and salts that form inside your kidneys. Your risk of developing kidney stones may be greater depending on your diet, your lifestyle, the medicines you take, and whether you have certain medical conditions. Most people can lower their chances of developing kidney stones by following the instructions below. Your dietitian may give you more specific instructions depending on your overall health and the type of kidney stones you tend to develop. What are tips for following this plan? Reading food labels  Choose foods with "no salt added" or "low-salt" labels. Limit your salt (sodium) intake to less than 1,500 mg a day. Choose foods with calcium for each meal and snack. Try to eat about 300 mg of calcium at each meal. Foods that contain 200-500 mg of calcium a serving include: 8 oz (237 mL) of milk, calcium-fortifiednon-dairy milk, and calcium-fortifiedfruit juice. Calcium-fortified means that calcium has been added to these drinks. 8 oz (237 mL) of kefir, yogurt, and soy yogurt. 4 oz (114 g) of tofu. 1 oz (28 g) of cheese. 1 cup (150 g) of dried figs. 1 cup (91 g) of cooked broccoli. One 3 oz (85 g) can of sardines or mackerel. Most people need 1,000-1,500 mg of calcium a day. Talk to your dietitian about how much calcium is recommended for you. Shopping Buy plenty of fresh fruits and vegetables. Most people do not need to avoid fruits and vegetables, even if these foods contain nutrients that may contribute to kidney stones. When shopping for convenience foods, choose: Whole pieces of fruit. Pre-made salads with dressing on the side. Low-fat fruit and yogurt smoothies. Avoid buying frozen meals or prepared deli foods. These can be high in sodium. Look for foods with live cultures, such as yogurt and kefir. Choose high-fiber grains, such as whole-wheat breads, oat bran, and wheat cereals. Cooking Do not add  salt to food when cooking. Place a salt shaker on the table and allow each person to add his or her own salt to taste. Use vegetable protein, such as beans, textured vegetable protein (TVP), or tofu, instead of meat in pasta, casseroles, and soups. Meal planning Eat less salt, if told by your dietitian. To do this: Avoid eating processed or pre-made food. Avoid eating fast food. Eat less animal protein, including cheese, meat, poultry, or fish, if told by your dietitian. To do this: Limit the number of times you have meat, poultry, fish, or cheese each week. Eat a diet free of meat at least 2 days a week. Eat only one serving each day of meat, poultry, fish, or seafood. When you prepare animal protein, cut pieces into small portion sizes. For most meat and fish, one serving is about the size of the palm of your hand. Eat at least five servings of fresh fruits and vegetables each day. To do this: Keep fruits and vegetables on hand for snacks. Eat one piece of fruit or a handful of berries with breakfast. Have a salad and fruit at lunch. Have two kinds of vegetables at dinner. Limit foods that are high in a substance called oxalate. These include: Spinach (cooked), rhubarb, beets, sweet potatoes, and Swiss chard. Peanuts. Potato chips, french fries, and baked potatoes with skin on. Nuts and nut products. Chocolate. If you regularly take a diuretic medicine, make sure to eat at least 1 or 2 servings of fruits or vegetables that are high in potassium each day. These include: Avocado. Banana. Orange, prune,   carrot, or tomato juice. Baked potato. Cabbage. Beans and split peas. Lifestyle  Drink enough fluid to keep your urine pale yellow. This is the most important thing you can do. Spread your fluid intake throughout the day. If you drink alcohol: Limit how much you use to: 0-1 drink a day for women who are not pregnant. 0-2 drinks a day for men. Be aware of how much alcohol is in your  drink. In the [place] one drink equals one 12 oz bottle of beer (355 mL), one 5 oz glass of wine (148 mL), or one 1 oz glass of hard liquor (44 mL). Lose weight if told by your health care provider. Work with your dietitian to find an eating plan and weight loss strategies that work best for you. General information Talk to your health care provider and dietitian about taking daily supplements. You may be told the following depending on your health and the cause of your kidney stones: Not to take supplements with vitamin C. To take a calcium supplement. To take a daily probiotic supplement. To take other supplements such as magnesium, fish oil, or vitamin B6. Take over-the-counter and prescription medicines only as told by your health care provider. These include supplements. What foods should I limit? Limit your intake of the following foods, or eat them as told by your dietitian. Vegetables Spinach. Rhubarb. Beets. Canned vegetables. Pickles. Olives. Baked potatoes with skin. Grains Wheat bran. Baked goods. Salted crackers. Cereals high in sugar. Meats and other proteins Nuts. Nut butters. Large portions of meat, poultry, or fish. Salted, precooked, or cured meats, such as sausages, meat loaves, and hot dogs. Dairy Cheese. Beverages Regular soft drinks. Regular vegetable juice. Seasonings and condiments Seasoning blends with salt. Salad dressings. Soy sauce. Ketchup. Barbecue sauce. Other foods Canned soups. Canned pasta sauce. Casseroles. Pizza. Lasagna. Frozen meals. Potato chips. French fries. The items listed above may not be a complete list of foods and beverages you should limit. Contact a dietitian for more information. What foods should I avoid? Talk to your dietitian about specific foods you should avoid based on the type of kidney stones you have and your overall health. Fruits Grapefruit. The item listed above may not be a complete list of foods and beverages you should  avoid. Contact a dietitian for more information. Summary Kidney stones are deposits of minerals and salts that form inside your kidneys. You can lower your risk of kidney stones by making changes to your diet. The most important thing you can do is drink enough fluid. Drink enough fluid to keep your urine pale yellow. Talk to your dietitian about how much calcium you should have each day, and eat less salt and animal protein as told by your dietitian. This information is not intended to replace advice given to you by your health care provider. Make sure you discuss any questions you have with your health care provider. Document Revised: 08/09/2019 Document Reviewed: 08/09/2019 Elsevier Patient Education  2022 [employer]  

## 2021-05-22 NOTE — Progress Notes (Signed)
Urological Symptom Review  Patient is experiencing the following symptoms: Get up at night to urinate Stream starts and stops   Review of Systems  Gastrointestinal (upper)  : Negative for upper GI symptoms  Gastrointestinal (lower) : Negative for lower GI symptoms  Constitutional : Fatigue  Skin: Negative for skin symptoms  Eyes: Negative for eye symptoms  Ear/Nose/Throat : Negative for Ear/Nose/Throat symptoms  Hematologic/Lymphatic: Negative for Hematologic/Lymphatic symptoms  Cardiovascular : Negative for cardiovascular symptoms  Respiratory : Negative for respiratory symptoms  Endocrine: Negative for endocrine symptoms  Musculoskeletal: Joint pain  Neurological: Headaches  Psychologic: Depression Anxiety

## 2021-05-27 ENCOUNTER — Ambulatory Visit (HOSPITAL_COMMUNITY)
Admission: RE | Admit: 2021-05-27 | Discharge: 2021-05-27 | Disposition: A | Discharge status: Home / Self Care | Payer: Medicare HMO | Source: Ambulatory Visit | Attending: Urology | Admitting: Urology

## 2021-05-27 ENCOUNTER — Other Ambulatory Visit: Payer: Self-pay

## 2021-05-27 DIAGNOSIS — N1330 Unspecified hydronephrosis: Secondary | ICD-10-CM | POA: Diagnosis not present

## 2021-05-27 DIAGNOSIS — N281 Cyst of kidney, acquired: Secondary | ICD-10-CM | POA: Diagnosis not present

## 2021-05-27 DIAGNOSIS — N200 Calculus of kidney: Secondary | ICD-10-CM | POA: Diagnosis not present

## 2021-06-03 NOTE — Progress Notes (Signed)
Results sent via my chart 

## 2021-06-05 DIAGNOSIS — E663 Overweight: Secondary | ICD-10-CM | POA: Diagnosis not present

## 2021-06-05 DIAGNOSIS — M179 Osteoarthritis of knee, unspecified: Secondary | ICD-10-CM | POA: Diagnosis not present

## 2021-06-05 DIAGNOSIS — M1711 Unilateral primary osteoarthritis, right knee: Secondary | ICD-10-CM | POA: Diagnosis not present

## 2021-06-05 DIAGNOSIS — Z6826 Body mass index (BMI) 26.0-26.9, adult: Secondary | ICD-10-CM | POA: Diagnosis not present

## 2021-06-23 DIAGNOSIS — Z6826 Body mass index (BMI) 26.0-26.9, adult: Secondary | ICD-10-CM | POA: Diagnosis not present

## 2021-06-23 DIAGNOSIS — E663 Overweight: Secondary | ICD-10-CM | POA: Diagnosis not present

## 2021-06-23 DIAGNOSIS — B029 Zoster without complications: Secondary | ICD-10-CM | POA: Diagnosis not present

## 2021-06-25 DIAGNOSIS — L508 Other urticaria: Secondary | ICD-10-CM | POA: Diagnosis not present

## 2021-06-25 DIAGNOSIS — Z6827 Body mass index (BMI) 27.0-27.9, adult: Secondary | ICD-10-CM | POA: Diagnosis not present

## 2021-07-16 ENCOUNTER — Other Ambulatory Visit: Payer: Self-pay

## 2021-07-16 ENCOUNTER — Other Ambulatory Visit (HOSPITAL_COMMUNITY): Payer: Self-pay | Admitting: Family Medicine

## 2021-07-16 ENCOUNTER — Ambulatory Visit (HOSPITAL_COMMUNITY)
Admission: RE | Admit: 2021-07-16 | Discharge: 2021-07-16 | Disposition: A | Discharge status: Home / Self Care | Payer: Medicare HMO | Source: Ambulatory Visit | Attending: Family Medicine | Admitting: Family Medicine

## 2021-07-16 DIAGNOSIS — M179 Osteoarthritis of knee, unspecified: Secondary | ICD-10-CM | POA: Diagnosis not present

## 2021-07-16 DIAGNOSIS — Z6827 Body mass index (BMI) 27.0-27.9, adult: Secondary | ICD-10-CM | POA: Diagnosis not present

## 2021-07-16 DIAGNOSIS — M25461 Effusion, right knee: Secondary | ICD-10-CM | POA: Diagnosis not present

## 2021-07-16 DIAGNOSIS — E663 Overweight: Secondary | ICD-10-CM | POA: Diagnosis not present

## 2021-07-16 DIAGNOSIS — M1711 Unilateral primary osteoarthritis, right knee: Secondary | ICD-10-CM | POA: Diagnosis not present

## 2021-08-03 DIAGNOSIS — M25551 Pain in right hip: Secondary | ICD-10-CM | POA: Diagnosis not present

## 2021-08-03 DIAGNOSIS — M7631 Iliotibial band syndrome, right leg: Secondary | ICD-10-CM | POA: Diagnosis not present

## 2021-08-03 DIAGNOSIS — M25561 Pain in right knee: Secondary | ICD-10-CM | POA: Diagnosis not present

## 2021-08-11 ENCOUNTER — Other Ambulatory Visit (HOSPITAL_COMMUNITY): Payer: Self-pay | Admitting: Internal Medicine

## 2021-08-11 DIAGNOSIS — E663 Overweight: Secondary | ICD-10-CM | POA: Diagnosis not present

## 2021-08-11 DIAGNOSIS — Z6827 Body mass index (BMI) 27.0-27.9, adult: Secondary | ICD-10-CM | POA: Diagnosis not present

## 2021-08-11 DIAGNOSIS — F411 Generalized anxiety disorder: Secondary | ICD-10-CM | POA: Diagnosis not present

## 2021-08-11 DIAGNOSIS — E039 Hypothyroidism, unspecified: Secondary | ICD-10-CM | POA: Diagnosis not present

## 2021-08-11 DIAGNOSIS — M5416 Radiculopathy, lumbar region: Secondary | ICD-10-CM

## 2021-08-11 DIAGNOSIS — I10 Essential (primary) hypertension: Secondary | ICD-10-CM | POA: Diagnosis not present

## 2021-08-11 DIAGNOSIS — M179 Osteoarthritis of knee, unspecified: Secondary | ICD-10-CM | POA: Diagnosis not present

## 2021-09-02 ENCOUNTER — Other Ambulatory Visit: Payer: Self-pay

## 2021-09-02 ENCOUNTER — Ambulatory Visit (HOSPITAL_COMMUNITY): Payer: Medicare HMO | Attending: Student | Admitting: Physical Therapy

## 2021-09-02 ENCOUNTER — Encounter (HOSPITAL_COMMUNITY): Payer: Self-pay | Admitting: Physical Therapy

## 2021-09-02 DIAGNOSIS — M5416 Radiculopathy, lumbar region: Secondary | ICD-10-CM | POA: Diagnosis not present

## 2021-09-02 DIAGNOSIS — M179 Osteoarthritis of knee, unspecified: Secondary | ICD-10-CM | POA: Diagnosis not present

## 2021-09-02 DIAGNOSIS — M792 Neuralgia and neuritis, unspecified: Secondary | ICD-10-CM | POA: Diagnosis not present

## 2021-09-02 DIAGNOSIS — M79604 Pain in right leg: Secondary | ICD-10-CM | POA: Insufficient documentation

## 2021-09-02 DIAGNOSIS — M25551 Pain in right hip: Secondary | ICD-10-CM | POA: Insufficient documentation

## 2021-09-02 DIAGNOSIS — G8929 Other chronic pain: Secondary | ICD-10-CM | POA: Diagnosis not present

## 2021-09-02 DIAGNOSIS — R2689 Other abnormalities of gait and mobility: Secondary | ICD-10-CM | POA: Diagnosis not present

## 2021-09-02 DIAGNOSIS — E663 Overweight: Secondary | ICD-10-CM | POA: Diagnosis not present

## 2021-09-02 DIAGNOSIS — E063 Autoimmune thyroiditis: Secondary | ICD-10-CM | POA: Diagnosis not present

## 2021-09-02 DIAGNOSIS — Z6826 Body mass index (BMI) 26.0-26.9, adult: Secondary | ICD-10-CM | POA: Diagnosis not present

## 2021-09-02 DIAGNOSIS — I10 Essential (primary) hypertension: Secondary | ICD-10-CM | POA: Diagnosis not present

## 2021-09-02 NOTE — Therapy (Signed)
[facility] [address] [place], [place], [address] Phone: [phone]   Fax:  [phone]  Physical Therapy Evaluation  Patient Details  Name: Claire Barnett MRN: [id number] Date of Birth: [birth year] Referring Provider (PT): Barrie Dunker PA   Encounter Date: 09/02/2021   PT End of Session - 09/02/21 1325     Visit Number 1    Number of Visits 12    Date for PT Re-Evaluation 10/14/21    Authorization Type [employer]    Authorization Time Period Check auth    Progress Note Due on Visit 10    PT Start Time 1303    PT Stop Time 1340    PT Time Calculation (min) 37 min    Activity Tolerance Patient tolerated treatment well    Behavior During Therapy [facility] for tasks assessed/performed             Past Medical History:  Diagnosis Date   Anxiety    History of kidney stones    Hypertension    S/P colonoscopy August 2007   Rare sigmoid diverticulosis.  Otherwise, no polyps, inflammatory    Past Surgical History:  Procedure Laterality Date   ABDOMINAL HYSTERECTOMY     BREAST LUMPECTOMY Right    benign   CATARACT EXTRACTION W/PHACO Right 02/01/2013   Procedure: CATARACT EXTRACTION PHACO AND INTRAOCULAR LENS PLACEMENT (IOC);  Surgeon: Gemma Payor, MD;  Location: [facility];  Service: Ophthalmology;  Laterality: Right;  CDE:25.80   COLONOSCOPY  05/12/2011   Procedure: COLONOSCOPY;  Surgeon: Arlyce Harman, MD;  Location: [facility];  Service: Endoscopy;  Laterality: N/A;  10:30AM   CYSTOSCOPY W/ STONE MANIPULATION     x3   IR NEPHROSTOMY PLACEMENT LEFT  08/15/2020   kidney stones     X 4 procedures   NEPHROLITHOTOMY Left 08/18/2020   Procedure: NEPHROLITHOTOMY PERCUTANEOUS.;  Surgeon: Malen Gauze, MD;  Location: [facility];  Service: Urology;  Laterality: Left;    There were no vitals filed for this visit.    Subjective Assessment - 09/02/21 1307     Subjective Patient presents to therapy with complaint of RT leg and  hip pain. She says this has been ongoing for several months. She has tried medications and injections. She is still having pain.    Limitations Standing;Walking;Lifting;House hold activities    Patient Stated Goals Decrease pain    Currently in Pain? Yes    Pain Score 10-Worst pain ever    Pain Location Leg    Pain Orientation Right    Pain Descriptors / Indicators Aching;Sharp    Pain Type Chronic pain    Pain Onset More than a month ago    Pain Frequency Constant    Aggravating Factors  sitting, standing, walking    Pain Relieving Factors injections, meds, topical rub    Effect of Pain on Daily Activities Limits                [facility] PT Assessment - 09/02/21 0001       Assessment   Medical Diagnosis RT ITB syndrome    Referring Provider (PT) Barrie Dunker PA    Prior Therapy Yes      Precautions   Precautions None      Restrictions   Weight Bearing Restrictions No      Balance Screen   Has the patient fallen in the past 6 months No      Home Environment   Living  Environment Private residence    Living Arrangements Spouse/significant other      ROM / Strength   AROM / PROM / Strength AROM;Strength      AROM   AROM Assessment Site Hip;Knee;Ankle      Strength   Overall Strength Comments Hip extension tested in sidelying    Strength Assessment Site Hip;Knee;Ankle    Right/Left Hip Right;Left    Right Hip Flexion 4/5    Right Hip Extension 4/5    Right Hip ABduction 4/5    Left Hip Flexion 4+/5    Left Hip Extension 4+/5    Left Hip ABduction 4+/5    Right/Left Knee Right;Left    Right Knee Extension 4+/5    Left Knee Extension 5/5    Right/Left Ankle Right;Left    Right Ankle Dorsiflexion 5/5    Left Ankle Dorsiflexion 5/5      Palpation   Palpation comment Increased TTP about RT trochanter, ITB distribution      Ambulation/Gait   Ambulation/Gait Yes    Ambulation/Gait Assistance 7: Independent    Assistive device None    Gait Pattern Decreased  step length - left;Decreased stance time - right;Antalgic    Ambulation Surface Level;Indoor    Gait velocity decreased                        Objective measurements completed on examination: See above findings.       [facility] Adult PT Treatment/Exercise - 09/02/21 0001       Exercises   Exercises Knee/Hip      Knee/Hip Exercises: Stretches   Other Knee/Hip Stretches SKTC stretch 3 x 10", LTR 3 x 10"      Knee/Hip Exercises: Supine   Bridges 5 reps                     PT Education - 09/02/21 1309     Education Details on evalution findings, POC and HEP    Person(s) Educated Patient    Methods Explanation;Handout    Comprehension Verbalized understanding              PT Short Term Goals - 09/02/21 1330       PT SHORT TERM GOAL #1   Title Patient will be independent with initial HEP and self-management strategies to improve functional outcomes    Time 3    Period Weeks    Status New    Target Date 09/23/21      PT SHORT TERM GOAL #2   Title Patient will report at least 30% overall improvement in subjective complaint to indicate improvement in ability to perform ADLs.    Time 3    Period Weeks    Status New    Target Date 09/23/21               PT Long Term Goals - 09/02/21 1330       PT LONG TERM GOAL #1   Title Patient will report at least 75% overall improvement in subjective complaint to indicate improvement in ability to perform ADLs.    Time 6    Period Weeks    Status New    Target Date 10/14/21      PT LONG TERM GOAL #2   Title Patient will be independent with advanced HEP and self-management strategies to improve functional outcomes    Time 6    Period Weeks    Status New  Target Date 10/14/21      PT LONG TERM GOAL #3   Title Patient will have equal to or > 4+/5 MMT throughout RLE to improve ability to perform functional mobility, stair ambulation and ADLs.    Time 6    Period Weeks    Status New     Target Date 10/14/21                    Plan - 09/02/21 1326     Clinical Impression Statement Patient is an 86 y.o. female who presents to physical therapy with complaint of RT hip/ leg pain. Patient demonstrates decreased strength, increased tenderness to palpation, fascial restriction and gait abnormalities which are likely contributing to symptoms of pain and are negatively impacting patient ability to perform ADLs and functional mobility tasks. Patient will benefit from skilled physical therapy services to address these deficits to reduce pain, improve level of function with ADLs and functional mobility tasks.    Examination-Activity Limitations Locomotion Level;Transfers;Stand;Stairs;Squat;Bend;Sleep    Examination-Participation Restrictions Cleaning;Community Activity;Meal Prep;Laundry;Yard Work    Stability/Clinical Decision Making Stable/Uncomplicated    OptometristClinical Decision Making Low    Rehab Potential Good    PT Frequency 2x / week    PT Duration 6 weeks    PT Treatment/Interventions ADLs/Self Care Home Management;Ultrasound;Parrafin;Neuromuscular re-education;Compression bandaging;Visual/perceptual remediation/compensation;Scar mobilization;Passive range of motion;Spinal Manipulations;Dry needling;Joint Manipulations;Orthotic Fit/Training;DME Instruction;Gait training;Patient/family education;Contrast Bath;Fluidtherapy;Biofeedback;Canalith Repostioning;Stair training;Cryotherapy;Taping;Splinting;Energy conservation;Functional mobility training;Electrical Stimulation;Iontophoresis 4mg /ml Dexamethasone;Therapeutic activities;Manual techniques;Therapeutic exercise;Moist Heat;Traction;Balance training;Manual lymph drainage;Vasopneumatic Device;Vestibular    PT Next Visit Plan Review HEP. Progress glute strengthening, hip adduction, clamshell, ITB stretch. Manual STM to RT ITB to address pain and restricrion    PT Home Exercise Plan Eval: bridge, SKTC, LTR    Consulted and Agree with  Plan of Care Patient             Patient will benefit from skilled therapeutic intervention in order to improve the following deficits and impairments:  Abnormal gait, Decreased activity tolerance, Difficulty walking, Decreased strength, Pain, Increased fascial restricitons, Improper body mechanics  Visit Diagnosis: Pain in right hip  Pain in right leg  Other abnormalities of gait and mobility     Problem List Patient Active Problem List   Diagnosis Date Noted   Renal calculus 08/18/2020   UTI (lower urinary tract infection) 09/07/2013   Encephalopathy acute 09/07/2013   Influenza 09/07/2013   HTN (hypertension) 09/07/2013   Anxiety 09/07/2013   Family hx of colon cancer 04/15/2011   1:35 PM, 09/02/21 Georges Lynchameron Trinidy Masterson PT DPT  Physical Therapist with [place]  [facility]  [phone]  [facility] [address] [place], [place], [address] Phone: [phone]   Fax:  [phone]  Name: Claire Barnett MRN: [id number] Date of Birth: [birth year]

## 2021-09-02 NOTE — Patient Instructions (Signed)
Access Code: [email or web address] URL: [email or web address] Date: 09/02/2021 Prepared by: Josue Hector  Exercises Supine Bridge - 3 x daily - 7 x weekly - 1 sets - 10 reps - 5 second hold Hooklying Single Knee to Chest Stretch - 3 x daily - 7 x weekly - 1 sets - 10 reps - 10 second hold Supine Lower Trunk Rotation - 3 x daily - 7 x weekly - 1 sets - 10 reps - 10 second hold

## 2021-09-07 ENCOUNTER — Ambulatory Visit (HOSPITAL_COMMUNITY): Payer: Medicare HMO | Admitting: Physical Therapy

## 2021-09-07 ENCOUNTER — Other Ambulatory Visit: Payer: Self-pay

## 2021-09-07 DIAGNOSIS — R2689 Other abnormalities of gait and mobility: Secondary | ICD-10-CM | POA: Diagnosis not present

## 2021-09-07 DIAGNOSIS — M79604 Pain in right leg: Secondary | ICD-10-CM

## 2021-09-07 DIAGNOSIS — M25551 Pain in right hip: Secondary | ICD-10-CM

## 2021-09-07 NOTE — Therapy (Signed)
[place], [place], [address] Phone: [phone]   Fax:  [phone]  Physical Therapy Treatment  Patient Details  Name: Claire Barnett MRN: [id number] Date of Birth: 1936/06/25 Referring Provider (PT): Cherlynn June PA   Encounter Date: 09/07/2021   PT End of Session - 09/07/21 1632     Visit Number 2    Number of Visits 12    Date for PT Re-Evaluation 10/14/21    Authorization Type [employer]    Authorization Time Period Check auth    Progress Note Due on Visit 10    PT Start Time 1646    PT Stop Time 1730    PT Time Calculation (min) 44 min    Activity Tolerance Patient tolerated treatment well    Behavior During Therapy [facility] for tasks assessed/performed             Past Medical History:  Diagnosis Date   Anxiety    History of kidney stones    Hypertension    S/P colonoscopy August 2007   Rare sigmoid diverticulosis.  Otherwise, no polyps, inflammatory    Past Surgical History:  Procedure Laterality Date   ABDOMINAL HYSTERECTOMY     BREAST LUMPECTOMY Right    benign   CATARACT EXTRACTION W/PHACO Right 02/01/2013   Procedure: CATARACT EXTRACTION PHACO AND INTRAOCULAR LENS PLACEMENT ([place]);  Surgeon: Tonny Branch, MD;  Location: [facility];  Service: Ophthalmology;  Laterality: Right;  CDE:25.80   COLONOSCOPY  05/12/2011   Procedure: COLONOSCOPY;  Surgeon: Dorothyann Peng, MD;  [facility];  Service: Endoscopy;  Laterality: N/A;  10:30AM   CYSTOSCOPY W/ STONE MANIPULATION     x3   IR NEPHROSTOMY PLACEMENT LEFT  08/15/2020   kidney stones     X 4 procedures   NEPHROLITHOTOMY Left 08/18/2020   Procedure: NEPHROLITHOTOMY PERCUTANEOUS.;  Surgeon: Cleon Gustin, MD;  Location: [facility];  Service: Urology;  Laterality: Left;    There were no vitals filed for this visit.   Subjective Assessment - 09/07/21 1449     Subjective pt states most of her pain today is around her Rt patellar  region and into shin.  Reports pain at 7/10.  States no pain in her hip and hasn't in a week or so.    Currently in Pain? Yes    Pain Score 7     Pain Location Knee    Pain Orientation Right    Pain Descriptors / Indicators Aching;Nagging;Dull;Numbness                               [facility] Adult PT Treatment/Exercise - 09/07/21 0001       Knee/Hip Exercises: Stretches   Other Knee/Hip Stretches SKTC stretch 3 x 10", LTR 3 x 10"      Knee/Hip Exercises: Seated   Long Arc Quad Both;10 reps    Long [employer] Limitations 5 second holds    Sit to [employer] 5 reps;without UE support      Knee/Hip Exercises: Supine   Bridges 10 reps    Straight Leg Raises Both;10 reps    Other Supine Knee/Hip Exercises abdominal bracing 10X, clam with abdominal bracing 10X, glute sets 10X                     PT Education - 09/07/21 1459     Education Details reveiwed goals,  HEP and POC moving forward.    Person(s) Educated Patient    Methods Explanation;Demonstration;Tactile cues;Verbal cues    Comprehension Verbalized understanding;Returned demonstration;Verbal cues required;Tactile cues required              PT Short Term Goals - 09/07/21 1451       PT SHORT TERM GOAL #1   Title Patient will be independent with initial HEP and self-management strategies to improve functional outcomes    Time 3    Period Weeks    Status On-going    Target Date 09/23/21      PT SHORT TERM GOAL #2   Title Patient will report at least 30% overall improvement in subjective complaint to indicate improvement in ability to perform ADLs.    Time 3    Period Weeks    Status On-going    Target Date 09/23/21               PT Long Term Goals - 09/07/21 1452       PT LONG TERM GOAL #1   Title Patient will report at least 75% overall improvement in subjective complaint to indicate improvement in ability to perform ADLs.    Time 6    Period Weeks    Status On-going    Target  Date 10/14/21      PT LONG TERM GOAL #2   Title Patient will be independent with advanced HEP and self-management strategies to improve functional outcomes    Time 6    Period Weeks    Status On-going    Target Date 10/14/21      PT LONG TERM GOAL #3   Title Patient will have equal to or > 4+/5 MMT throughout RLE to improve ability to perform functional mobility, stair ambulation and ADLs.    Time 6    Period Weeks    Status On-going    Target Date 10/14/21                   Plan - 09/07/21 1634     Clinical Impression Statement Reviewed goals and POC moving forward.  Began with seated LAQ for strengthening with cues needed to maintain upright seated posture.  Pt with progressive backward lean while holding contraction for 5 seconds.   Noted weakness in Rt as compared to Lt LE.  Educated on core stability and importance of maintaining good posture.  No new exercises added to HEP this session as she is not independent with established HEP given last session. Pt reported no increase in pain or symptoms at end of session.    Examination-Activity Limitations Locomotion Level;Transfers;Stand;Stairs;Squat;Bend;Sleep    Examination-Participation Restrictions Cleaning;Community Activity;Meal Prep;Laundry;Yard Work    Stability/Clinical Decision Making Stable/Uncomplicated    Rehab Potential Good    PT Frequency 2x / week    PT Duration 6 weeks    PT Treatment/Interventions ADLs/Self Care Home Management;Ultrasound;Parrafin;Neuromuscular re-education;Compression bandaging;Visual/perceptual remediation/compensation;Scar mobilization;Passive range of motion;Spinal Manipulations;Dry needling;Joint Manipulations;Orthotic Fit/Training;DME Instruction;Gait training;Patient/family education;Contrast Bath;Fluidtherapy;Biofeedback;Canalith Repostioning;Stair training;Cryotherapy;Taping;Splinting;Energy conservation;Functional mobility training;Electrical Stimulation;Iontophoresis 4mg /ml  Dexamethasone;Therapeutic activities;Manual techniques;Therapeutic exercise;Moist Heat;Traction;Balance training;Manual lymph drainage;Vasopneumatic Device;Vestibular    PT Next Visit Plan Progress glute strengthening, hip adduction, clamshell, ITB stretch. Manual STM to RT ITB to address pain and restricrion    PT Home Exercise Plan Eval: bridge, SKTC, LTR    Consulted and Agree with Plan of Care Patient             Patient will benefit from skilled therapeutic intervention in order  to improve the following deficits and impairments:  Abnormal gait, Decreased activity tolerance, Difficulty walking, Decreased strength, Pain, Increased fascial restricitons, Improper body mechanics  Visit Diagnosis: Pain in right hip  Pain in right leg  Other abnormalities of gait and mobility     Problem List Patient Active Problem List   Diagnosis Date Noted   Renal calculus 08/18/2020   UTI (lower urinary tract infection) 09/07/2013   Encephalopathy acute 09/07/2013   Influenza 09/07/2013   HTN (hypertension) 09/07/2013   Anxiety 09/07/2013   Family hx of colon cancer 04/15/2011   Teena Irani, PTA/CLT, WTA [phone]  Teena Irani, PTA 09/07/2021, 4:39 PM  [place], [place], [address] Phone: [phone]   Fax:  [phone]  Name: Claire Barnett MRN: [id number] Date of Birth: Dec 29, 1935

## 2021-09-16 ENCOUNTER — Encounter (HOSPITAL_COMMUNITY): Payer: Self-pay | Admitting: Physical Therapy

## 2021-09-16 ENCOUNTER — Ambulatory Visit (HOSPITAL_COMMUNITY): Payer: Medicare HMO | Admitting: Physical Therapy

## 2021-09-16 ENCOUNTER — Other Ambulatory Visit: Payer: Self-pay

## 2021-09-16 DIAGNOSIS — M25551 Pain in right hip: Secondary | ICD-10-CM

## 2021-09-16 DIAGNOSIS — M79604 Pain in right leg: Secondary | ICD-10-CM

## 2021-09-16 DIAGNOSIS — R2689 Other abnormalities of gait and mobility: Secondary | ICD-10-CM

## 2021-09-16 NOTE — Therapy (Signed)
[facility] [address] [place], [place], [address] Phone: [phone]   Fax:  [phone]  Physical Therapy Treatment  Patient Details  Name: Claire Barnett MRN: [id number] Date of Birth: 1936/03/23 Referring Provider (PT): Barrie Dunker PA   Encounter Date: 09/16/2021   PT End of Session - 09/16/21 1308     Visit Number 3    Number of Visits 12    Date for PT Re-Evaluation 10/14/21    Authorization Type [employer]    Authorization Time Period 12 approved 1/4-2/15/23    Progress Note Due on Visit 10    PT Start Time 1305    PT Stop Time 1343    PT Time Calculation (min) 38 min    Activity Tolerance Patient tolerated treatment well    Behavior During Therapy [facility] for tasks assessed/performed             Past Medical History:  Diagnosis Date   Anxiety    History of kidney stones    Hypertension    S/P colonoscopy August 2007   Rare sigmoid diverticulosis.  Otherwise, no polyps, inflammatory    Past Surgical History:  Procedure Laterality Date   ABDOMINAL HYSTERECTOMY     BREAST LUMPECTOMY Right    benign   CATARACT EXTRACTION W/PHACO Right 02/01/2013   Procedure: CATARACT EXTRACTION PHACO AND INTRAOCULAR LENS PLACEMENT (IOC);  Surgeon: Gemma Payor, MD;  [facility]: [facility];  Service: Ophthalmology;  Laterality: Right;  CDE:25.80   COLONOSCOPY  05/12/2011   Procedure: COLONOSCOPY;  Surgeon: Arlyce Harman, MD;  [facility];  Service: Endoscopy;  Laterality: N/A;  10:30AM   CYSTOSCOPY W/ STONE MANIPULATION     x3   IR NEPHROSTOMY PLACEMENT LEFT  08/15/2020   kidney stones     X 4 procedures   NEPHROLITHOTOMY Left 08/18/2020   Procedure: NEPHROLITHOTOMY PERCUTANEOUS.;  Surgeon: Malen Gauze, MD;  Location: [facility];  Service: Urology;  Laterality: Left;    There were no vitals filed for this visit.   Subjective Assessment - 09/16/21 1309     Subjective Patient says she had a fit" with her RT leg  yesterday. Her whole leg was hurting, today it is more just the knee.    Limitations Standing;Walking;Lifting;House hold activities    Currently in Pain? Yes    Pain Score 9     Pain Location Knee    Pain Orientation Right    Pain Descriptors / Indicators Aching;Constant    Pain Type Chronic pain                               [facility] Adult PT Treatment/Exercise - 09/16/21 0001       Knee/Hip Exercises: Stretches   Other Knee/Hip Stretches SKTC stretch 5 x 10", LTR 5 x 10"      Knee/Hip Exercises: Supine   Quad Sets Right;10 reps    Heel Slides Right;10 reps    Bridges 10 reps    Straight Leg Raises Both;10 reps;1 set      Knee/Hip Exercises: Sidelying   Clams x 15 each      Manual Therapy   Manual Therapy Soft tissue mobilization;Joint mobilization    Manual therapy comments Completed separate from all other activity    Joint Mobilization RT patellar mobs in all planes to reduce knee pain    Soft tissue mobilization STM to RT ITB using foam  roller                       PT Short Term Goals - 09/07/21 1451       PT SHORT TERM GOAL #1   Title Patient will be independent with initial HEP and self-management strategies to improve functional outcomes    Time 3    Period Weeks    Status On-going    Target Date 09/23/21      PT SHORT TERM GOAL #2   Title Patient will report at least 30% overall improvement in subjective complaint to indicate improvement in ability to perform ADLs.    Time 3    Period Weeks    Status On-going    Target Date 09/23/21               PT Long Term Goals - 09/07/21 1452       PT LONG TERM GOAL #1   Title Patient will report at least 75% overall improvement in subjective complaint to indicate improvement in ability to perform ADLs.    Time 6    Period Weeks    Status On-going    Target Date 10/14/21      PT LONG TERM GOAL #2   Title Patient will be independent with advanced HEP and self-management  strategies to improve functional outcomes    Time 6    Period Weeks    Status On-going    Target Date 10/14/21      PT LONG TERM GOAL #3   Title Patient will have equal to or > 4+/5 MMT throughout RLE to improve ability to perform functional mobility, stair ambulation and ADLs.    Time 6    Period Weeks    Status On-going    Target Date 10/14/21                   Plan - 09/16/21 1332     Clinical Impression Statement Patient tolerated session well today. Added quad set and heel slides for Rt knee pain. Patient noting some improvement following. Also added patellar mobs and STM to RT ITB to address pain and restrictions. Patient cued on foot placement for bridging and on form with added quad setting for improved quadricep activation. Patient will continue to benefit from skilled therapy services to reduce deficits and improve functional ability.    Examination-Activity Limitations Locomotion Level;Transfers;Stand;Stairs;Squat;Bend;Sleep    Examination-Participation Restrictions Cleaning;Community Activity;Meal Prep;Laundry;Yard Work    Stability/Clinical Decision Making Stable/Uncomplicated    Rehab Potential Good    PT Frequency 2x / week    PT Duration 6 weeks    PT Treatment/Interventions ADLs/Self Care Home Management;Ultrasound;Parrafin;Neuromuscular re-education;Compression bandaging;Visual/perceptual remediation/compensation;Scar mobilization;Passive range of motion;Spinal Manipulations;Dry needling;Joint Manipulations;Orthotic Fit/Training;DME Instruction;Gait training;Patient/family education;Contrast Bath;Fluidtherapy;Biofeedback;Canalith Repostioning;Stair training;Cryotherapy;Taping;Splinting;Energy conservation;Functional mobility training;Electrical Stimulation;Iontophoresis 4mg /ml Dexamethasone;Therapeutic activities;Manual techniques;Therapeutic exercise;Moist Heat;Traction;Balance training;Manual lymph drainage;Vasopneumatic Device;Vestibular    PT Next Visit Plan  Progress glute strengthening, hip adduction, clamshell, ITB stretch. Manual STM to RT ITB to address pain and restricrion    PT Home Exercise Plan Eval: bridge, SKTC, LTR 1/18 quad set, heel slide, clamshell    Consulted and Agree with Plan of Care Patient             Patient will benefit from skilled therapeutic intervention in order to improve the following deficits and impairments:  Abnormal gait, Decreased activity tolerance, Difficulty walking, Decreased strength, Pain, Increased fascial restricitons, Improper body mechanics  Visit Diagnosis: Pain in right hip  Pain in right leg  Other abnormalities of gait and mobility     Problem List Patient Active Problem List   Diagnosis Date Noted   Renal calculus 08/18/2020   UTI (lower urinary tract infection) 09/07/2013   Encephalopathy acute 09/07/2013   Influenza 09/07/2013   HTN (hypertension) 09/07/2013   Anxiety 09/07/2013   Family hx of colon cancer 04/15/2011   1:36 PM, 09/16/21 Georges Lynch PT DPT  Physical Therapist with [place]  [facility]  [phone]   [facility] [address] [place], [place], [address] Phone: [phone]   Fax:  [phone]  Name: Claire Barnett MRN: [id number] Date of Birth: 1936/02/14

## 2021-09-16 NOTE — Patient Instructions (Signed)
Access Code: [facility] URL: [email or web address] Date: 09/16/2021 Prepared by: Georges Lynch  Exercises Supine Heel Slide - 2-3 x daily - 7 x weekly - 1 sets - 15 reps Supine Quad Set - 2-3 x daily - 7 x weekly - 1 sets - 15 reps - 5 second hold Clamshell - 2-3 x daily - 7 x weekly - 1-2 sets - 10 reps

## 2021-09-23 ENCOUNTER — Other Ambulatory Visit: Payer: Self-pay

## 2021-09-23 ENCOUNTER — Ambulatory Visit (HOSPITAL_COMMUNITY): Payer: Medicare HMO | Admitting: Physical Therapy

## 2021-09-23 DIAGNOSIS — R2689 Other abnormalities of gait and mobility: Secondary | ICD-10-CM | POA: Diagnosis not present

## 2021-09-23 DIAGNOSIS — M79604 Pain in right leg: Secondary | ICD-10-CM | POA: Diagnosis not present

## 2021-09-23 DIAGNOSIS — M25551 Pain in right hip: Secondary | ICD-10-CM

## 2021-09-23 NOTE — Therapy (Signed)
[place], [place], [address] Phone: [phone]   Fax:  [phone]  Physical Therapy Treatment  Patient Details  Name: Claire Barnett MRN: [id number] Date of Birth: [birth year] Referring Provider (PT): Cherlynn June PA   Encounter Date: 09/23/2021   PT End of Session - 09/23/21 1511     Visit Number 4    Number of Visits 12    Date for PT Re-Evaluation 10/14/21    Authorization Type [employer]    Authorization Time Period 12 approved 1/4-2/15/23    Progress Note Due on Visit 10    PT Start Time 1410    PT Stop Time 1454    PT Time Calculation (min) 44 min    Activity Tolerance Patient tolerated treatment well    Behavior During Therapy [facility] for tasks assessed/performed             Past Medical History:  Diagnosis Date   Anxiety    History of kidney stones    Hypertension    S/P colonoscopy August 2007   Rare sigmoid diverticulosis.  Otherwise, no polyps, inflammatory    Past Surgical History:  Procedure Laterality Date   ABDOMINAL HYSTERECTOMY     BREAST LUMPECTOMY Right    benign   CATARACT EXTRACTION W/PHACO Right 02/01/2013   Procedure: CATARACT EXTRACTION PHACO AND INTRAOCULAR LENS PLACEMENT ([facility]);  Surgeon: Tonny Branch, MD;  Location: [facility];  Service: Ophthalmology;  Laterality: Right;  CDE:25.80   COLONOSCOPY  05/12/2011   Procedure: COLONOSCOPY;  Surgeon: Dorothyann Peng, MD;  Location: [facility];  Service: Endoscopy;  Laterality: N/A;  10:30AM   CYSTOSCOPY W/ STONE MANIPULATION     x3   IR NEPHROSTOMY PLACEMENT LEFT  08/15/2020   kidney stones     X 4 procedures   NEPHROLITHOTOMY Left 08/18/2020   Procedure: NEPHROLITHOTOMY PERCUTANEOUS.;  Surgeon: Cleon Gustin, MD;  Location: [facility];  Service: Urology;  Laterality: Left;    There were no vitals filed for this visit.                      [facility] Adult PT Treatment/Exercise - 09/23/21 0001        Knee/Hip Exercises: Stretches   Other Knee/Hip Stretches SKTC stretch 5 x 10", LTR 5 x 10"      Knee/Hip Exercises: Seated   Long Arc Quad Both;10 reps    Long [employer] Limitations 5 second holds    Sit to [employer] 2 sets;5 reps;without UE support      Knee/Hip Exercises: Supine   Quad Sets Right;10 reps    Heel Slides Right;10 reps    Bridges Both;15 reps    Straight Leg Raises Both;10 reps;1 set    Other Supine Knee/Hip Exercises abdominal bracing 10X, clam with abdominal bracing 10X, glute sets 10X      Knee/Hip Exercises: Sidelying   Clams x 15 each      Knee/Hip Exercises: Prone   Hamstring Curl 15 reps                       PT Short Term Goals - 09/07/21 1451       PT SHORT TERM GOAL #1   Title Patient will be independent with initial HEP and self-management strategies to improve functional outcomes    Time 3    Period Weeks    Status On-going    Target Date 09/23/21  PT SHORT TERM GOAL #2   Title Patient will report at least 30% overall improvement in subjective complaint to indicate improvement in ability to perform ADLs.    Time 3    Period Weeks    Status On-going    Target Date 09/23/21               PT Long Term Goals - 09/07/21 1452       PT LONG TERM GOAL #1   Title Patient will report at least 75% overall improvement in subjective complaint to indicate improvement in ability to perform ADLs.    Time 6    Period Weeks    Status On-going    Target Date 10/14/21      PT LONG TERM GOAL #2   Title Patient will be independent with advanced HEP and self-management strategies to improve functional outcomes    Time 6    Period Weeks    Status On-going    Target Date 10/14/21      PT LONG TERM GOAL #3   Title Patient will have equal to or > 4+/5 MMT throughout RLE to improve ability to perform functional mobility, stair ambulation and ADLs.    Time 6    Period Weeks    Status On-going    Target Date 10/14/21                    Plan - 09/23/21 1547     Clinical Impression Statement Continued with established strengthening for LEs.  Pt needs max verbal and tactile cues to complete in correct form.  Added prone hamstring curls and additional set of sit to stands without UE assist. Pt without any complaints or issues during or at completion of therex today. Patient will continue to benefit from skilled therapy services to reduce deficits and improve functional ability.    Examination-Activity Limitations Locomotion Level;Transfers;Stand;Stairs;Squat;Bend;Sleep    Examination-Participation Restrictions Cleaning;Community Activity;Meal Prep;Laundry;Yard Work    Stability/Clinical Decision Making Stable/Uncomplicated    Rehab Potential Good    PT Frequency 2x / week    PT Duration 6 weeks    PT Treatment/Interventions ADLs/Self Care Home Management;Ultrasound;Parrafin;Neuromuscular re-education;Compression bandaging;Visual/perceptual remediation/compensation;Scar mobilization;Passive range of motion;Spinal Manipulations;Dry needling;Joint Manipulations;Orthotic Fit/Training;DME Instruction;Gait training;Patient/family education;Contrast Bath;Fluidtherapy;Biofeedback;Canalith Repostioning;Stair training;Cryotherapy;Taping;Splinting;Energy conservation;Functional mobility training;Electrical Stimulation;Iontophoresis 4mg /ml Dexamethasone;Therapeutic activities;Manual techniques;Therapeutic exercise;Moist Heat;Traction;Balance training;Manual lymph drainage;Vasopneumatic Device;Vestibular    PT Next Visit Plan Progress glute and LE strengthening . Manual STM to RT ITB to address pain and restricrion as needed.    PT Home Exercise Plan Eval: bridge, SKTC, LTR 1/18 quad set, heel slide, clamshell    Consulted and Agree with Plan of Care Patient             Patient will benefit from skilled therapeutic intervention in order to improve the following deficits and impairments:  Abnormal gait, Decreased activity  tolerance, Difficulty walking, Decreased strength, Pain, Increased fascial restricitons, Improper body mechanics  Visit Diagnosis: Pain in right hip  Pain in right leg  Other abnormalities of gait and mobility     Problem List Patient Active Problem List   Diagnosis Date Noted   Renal calculus 08/18/2020   UTI (lower urinary tract infection) 09/07/2013   Encephalopathy acute 09/07/2013   Influenza 09/07/2013   HTN (hypertension) 09/07/2013   Anxiety 09/07/2013   Family hx of colon cancer 04/15/2011   Teena Irani, PTA/CLT, WTA [phone]  Teena Irani, PTA 09/23/2021, 3:48 PM  [facility]  [address] [place], [place], [address] Phone: [phone]   Fax:  [phone]  Name: Claire Barnett MRN: [id number] Date of Birth: 15-Dec-1935

## 2021-09-30 ENCOUNTER — Encounter (HOSPITAL_COMMUNITY): Payer: Medicare HMO | Admitting: Physical Therapy

## 2021-10-06 ENCOUNTER — Ambulatory Visit (HOSPITAL_COMMUNITY): Payer: Medicare HMO | Attending: Student | Admitting: Physical Therapy

## 2021-10-06 ENCOUNTER — Other Ambulatory Visit: Payer: Self-pay

## 2021-10-06 DIAGNOSIS — R2689 Other abnormalities of gait and mobility: Secondary | ICD-10-CM | POA: Insufficient documentation

## 2021-10-06 DIAGNOSIS — M79604 Pain in right leg: Secondary | ICD-10-CM | POA: Insufficient documentation

## 2021-10-06 DIAGNOSIS — M25551 Pain in right hip: Secondary | ICD-10-CM | POA: Diagnosis not present

## 2021-10-06 NOTE — Therapy (Addendum)
[facility] [address] [place], [place], [address] Phone: [phone]   Fax:  [phone]  Physical Therapy Treatment  Patient Details  Name: Claire Barnett MRN: [id number] Date of Birth: 1935/09/03 Referring Provider (PT): Cherlynn June PA  PHYSICAL THERAPY DISCHARGE SUMMARY  Visits from Start of Care: 5  Current functional level related to goals / functional outcomes: See below    Remaining deficits: See below    Education / Equipment: See assessment    Patient agrees to discharge. Patient goals were met. Patient is being discharged due to meeting the stated rehab goals.  Encounter Date: 10/06/2021   PT End of Session - 10/06/21 1427     Visit Number 5    Number of Visits 12    Date for PT Re-Evaluation 10/14/21    Authorization Type [employer]    Authorization Time Period 12 approved 1/4-2/15/23    Progress Note Due on Visit 10    PT Start Time 1407    PT Stop Time 1430    PT Time Calculation (min) 23 min    Activity Tolerance Patient tolerated treatment well    Behavior During Therapy [facility] for tasks assessed/performed             Past Medical History:  Diagnosis Date   Anxiety    History of kidney stones    Hypertension    S/P colonoscopy August 2007   Rare sigmoid diverticulosis.  Otherwise, no polyps, inflammatory    Past Surgical History:  Procedure Laterality Date   ABDOMINAL HYSTERECTOMY     BREAST LUMPECTOMY Right    benign   CATARACT EXTRACTION W/PHACO Right 02/01/2013   Procedure: CATARACT EXTRACTION PHACO AND INTRAOCULAR LENS PLACEMENT ([facility]);  Surgeon: Tonny Branch, MD;  Location: [facility];  Service: Ophthalmology;  Laterality: Right;  CDE:25.80   COLONOSCOPY  05/12/2011   Procedure: COLONOSCOPY;  Surgeon: Dorothyann Peng, MD;  [facility];  Service: Endoscopy;  Laterality: N/A;  10:30AM   CYSTOSCOPY W/ STONE MANIPULATION     x3   IR NEPHROSTOMY PLACEMENT LEFT  08/15/2020   kidney  stones     X 4 procedures   NEPHROLITHOTOMY Left 08/18/2020   Procedure: NEPHROLITHOTOMY PERCUTANEOUS.;  Surgeon: Cleon Gustin, MD;  Location: [facility];  Service: Urology;  Laterality: Left;    There were no vitals filed for this visit.   Subjective Assessment - 10/06/21 1410     Subjective Pt states she is so much better and would like today to be her last day.    Currently in Pain? No/denies                [facility] PT Assessment - 10/06/21 1411       Assessment   Medical Diagnosis RT ITB syndrome    Referring Provider (PT) Cherlynn June PA    Prior Therapy Yes      Precautions   Precautions None      Restrictions   Weight Bearing Restrictions No      ROM / Strength   AROM / PROM / Strength AROM      AROM   AROM Assessment Site Hip;Knee;Ankle      Strength   Right Hip Flexion 5/5   was 4/5   Right Hip Extension 3+/5   was 4/5 tested in sidelying; tested in prone today   Right Hip ABduction 4+/5   was 4/5   Left Hip Flexion 5/5   was  4+/5   Left Hip Extension 4-/5   was 4+/5 tested in sidelying; tested in prone today   Left Hip ABduction 5/5   was 4+/5   Right Knee Extension 5/5   was 4+/5   Left Knee Extension 5/5   was 5/5   Right Ankle Dorsiflexion 5/5   was 5/5   Left Ankle Dorsiflexion 5/5   was 5/5     Palpation   Palpation comment negative   was tender around TTP and Rt trochanter, ITB distribution     Ambulation/Gait   Ambulation/Gait Yes    Ambulation/Gait Assistance 7: Independent    Ambulation Distance (Feet) --   community distances without AD                                     PT Short Term Goals - 10/06/21 1424       PT SHORT TERM GOAL #1   Title Patient will be independent with initial HEP and self-management strategies to improve functional outcomes    Time 3    Period Weeks    Status Achieved    Target Date 09/23/21      PT SHORT TERM GOAL #2   Title Patient will report at least 30% overall  improvement in subjective complaint to indicate improvement in ability to perform ADLs.    Time 3    Period Weeks    Status Achieved    Target Date 09/23/21               PT Long Term Goals - 10/06/21 1424       PT LONG TERM GOAL #1   Title Patient will report at least 75% overall improvement in subjective complaint to indicate improvement in ability to perform ADLs.    Time 6    Period Weeks    Status Achieved    Target Date 10/14/21      PT LONG TERM GOAL #2   Title Patient will be independent with advanced HEP and self-management strategies to improve functional outcomes    Time 6    Period Weeks    Status Achieved    Target Date 10/14/21      PT LONG TERM GOAL #3   Title Patient will have equal to or > 4+/5 MMT throughout RLE to improve ability to perform functional mobility, stair ambulation and ADLs.    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 Pt has made great gains in strenght, function and reduction in pain.  Pt has met all goals with exception of strenght due to continued weakness in hip extensors.  pt no longer has pain and tenderness on palpation.  Pt is now ready for discharge.    Examination-Activity Limitations Locomotion Level;Transfers;Stand;Stairs;Squat;Bend;Sleep    Examination-Participation Restrictions Cleaning;Community Activity;Meal Prep;Laundry;Yard Work    Stability/Clinical Decision Making Stable/Uncomplicated    Rehab Potential Good    PT Frequency 2x / week    PT Duration 6 weeks  PT Treatment/Interventions ADLs/Self Care Home Management;Ultrasound;Parrafin;Neuromuscular re-education;Compression bandaging;Visual/perceptual remediation/compensation;Scar  mobilization;Passive range of motion;Spinal Manipulations;Dry needling;Joint Manipulations;Orthotic Fit/Training;DME Instruction;Gait training;Patient/family education;Contrast Bath;Fluidtherapy;Biofeedback;Canalith Repostioning;Stair training;Cryotherapy;Taping;Splinting;Energy conservation;Functional mobility training;Electrical Stimulation;Iontophoresis 8m/ml Dexamethasone;Therapeutic activities;Manual techniques;Therapeutic exercise;Moist Heat;Traction;Balance training;Manual lymph drainage;Vasopneumatic Device;Vestibular    PT Next Visit Plan Discharge per goals met and patient request.    PT Home Exercise Plan Eval: bridge, SKTC, LTR 1/18 quad set, heel slide, clamshell    Consulted and Agree with Plan of Care Patient             Patient will benefit from skilled therapeutic intervention in order to improve the following deficits and impairments:  Abnormal gait, Decreased activity tolerance, Difficulty walking, Decreased strength, Pain, Increased fascial restricitons, Improper body mechanics  Visit Diagnosis: Pain in right hip  Pain in right leg  Other abnormalities of gait and mobility     Problem List Patient Active Problem List   Diagnosis Date Noted   Renal calculus 08/18/2020   UTI (lower urinary tract infection) 09/07/2013   Encephalopathy acute 09/07/2013   Influenza 09/07/2013   HTN (hypertension) 09/07/2013   Anxiety 09/07/2013   Family hx of colon cancer 04/15/2011   ATeena Irani PTA/CLT, WTA [phone] FTeena Irani PTA 10/06/2021, 2:44 PM  [place] [address]Phone: [phone]  Fax:  [phone] Name: ESALIMA RUMERMRN: [id number] of Birth: [birth year]

## 2021-10-13 ENCOUNTER — Encounter (HOSPITAL_COMMUNITY): Payer: Medicare HMO | Admitting: Physical Therapy

## 2021-10-21 DIAGNOSIS — E559 Vitamin D deficiency, unspecified: Secondary | ICD-10-CM | POA: Diagnosis not present

## 2021-10-21 DIAGNOSIS — I10 Essential (primary) hypertension: Secondary | ICD-10-CM | POA: Diagnosis not present

## 2021-10-21 DIAGNOSIS — M130 Polyarthritis, unspecified: Secondary | ICD-10-CM | POA: Diagnosis not present

## 2021-10-21 DIAGNOSIS — G5711 Meralgia paresthetica, right lower limb: Secondary | ICD-10-CM | POA: Diagnosis not present

## 2021-10-21 DIAGNOSIS — M5416 Radiculopathy, lumbar region: Secondary | ICD-10-CM | POA: Diagnosis not present

## 2021-10-21 DIAGNOSIS — G603 Idiopathic progressive neuropathy: Secondary | ICD-10-CM | POA: Diagnosis not present

## 2021-10-21 DIAGNOSIS — E039 Hypothyroidism, unspecified: Secondary | ICD-10-CM | POA: Diagnosis not present

## 2021-11-01 IMAGING — US US RENAL
1 series · 13 of 25 positions shown · non-contrast
Comparison: Prior renal ultrasound 11/13/2020

CLINICAL DATA: Nephrolithiasis follow-up

EXAM:
RENAL / URINARY TRACT ULTRASOUND COMPLETE

[Series 1: us renal · 13 of 73 slices shown]
[im 1/73]
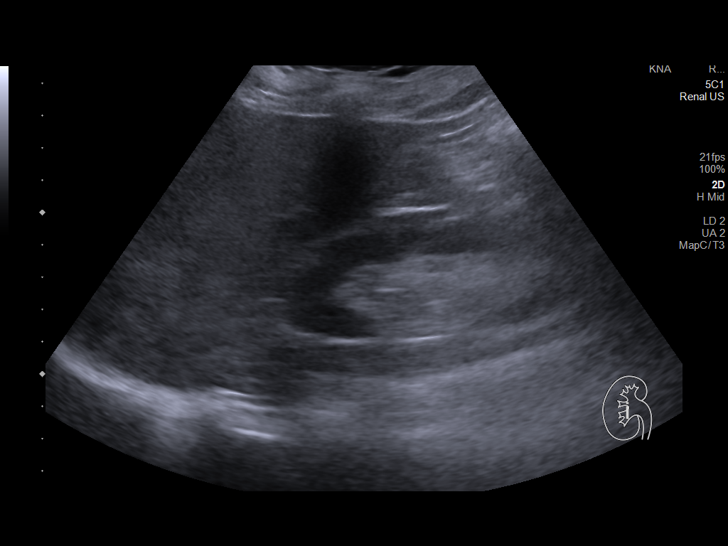
[im 7/73]
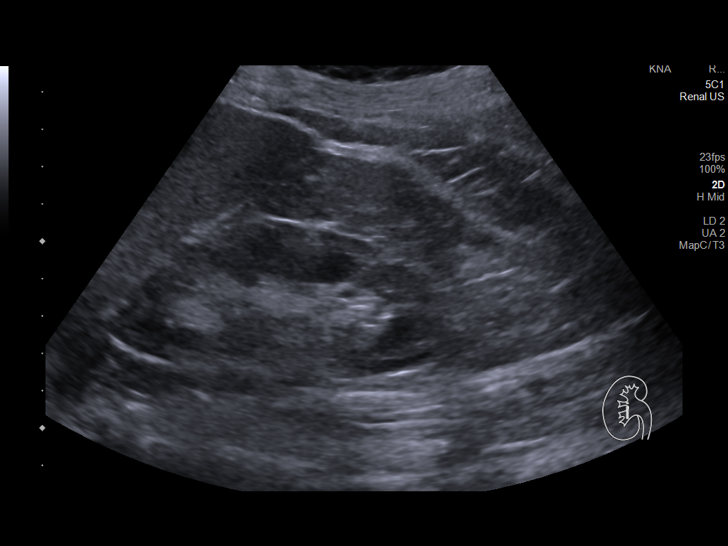
[im 13/73]
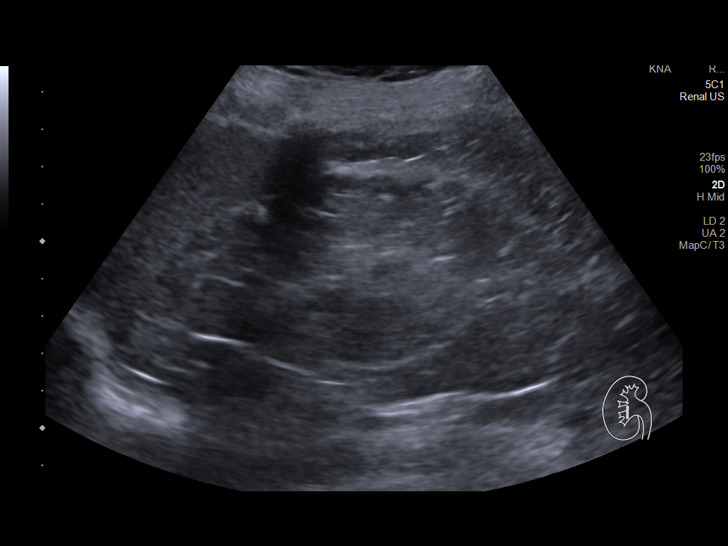
[im 19/73]
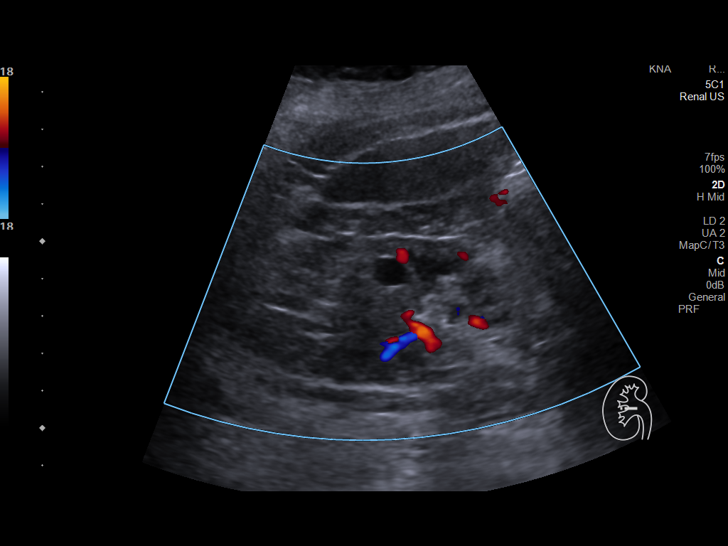
[im 25/73]
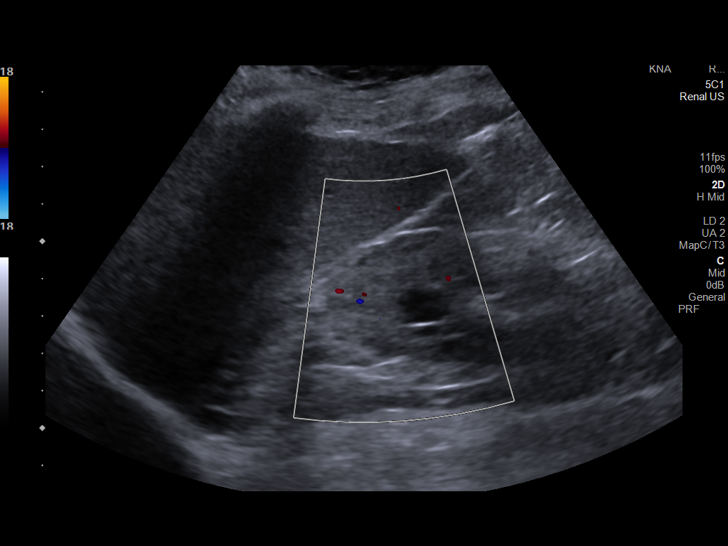
[im 31/73]
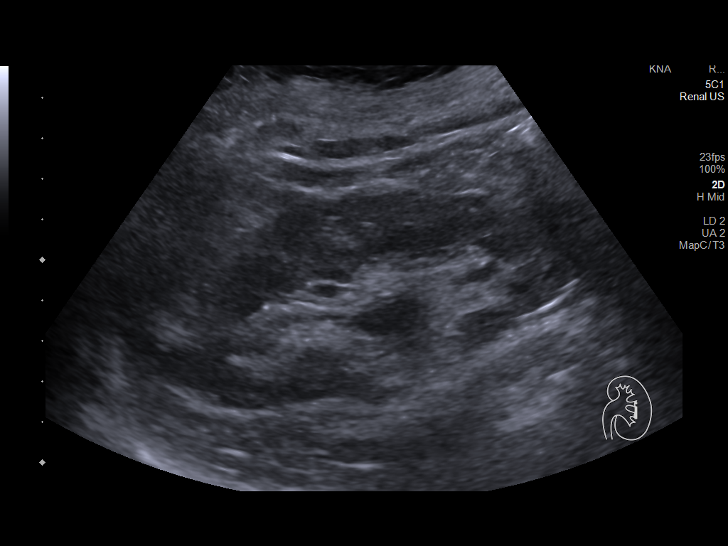
[im 37/73]
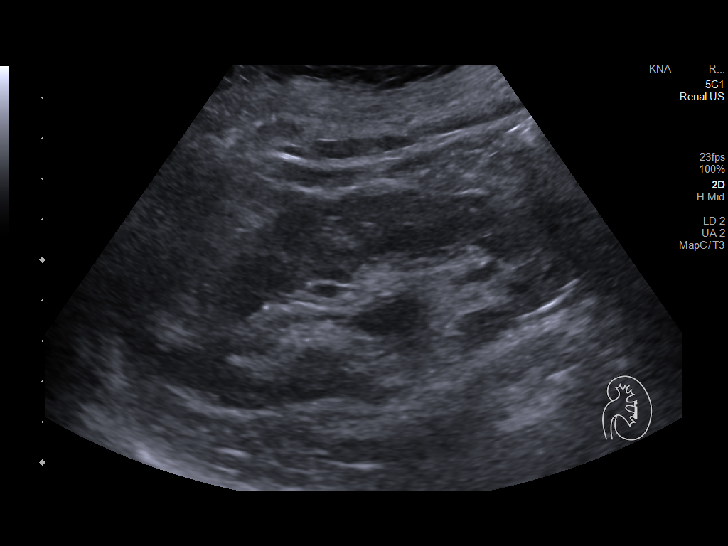
[im 43/73]
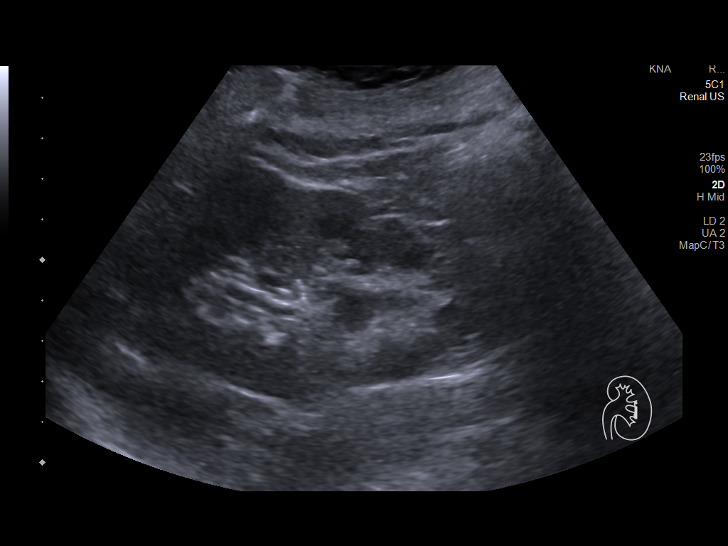
[im 49/73]
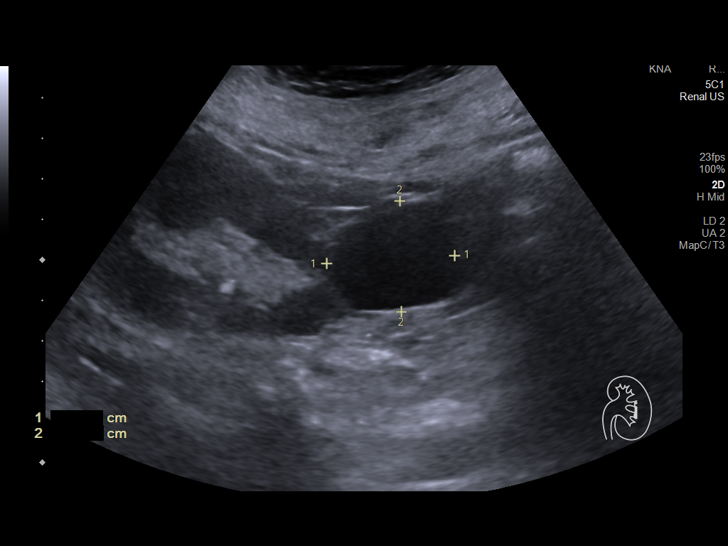
[im 55/73]
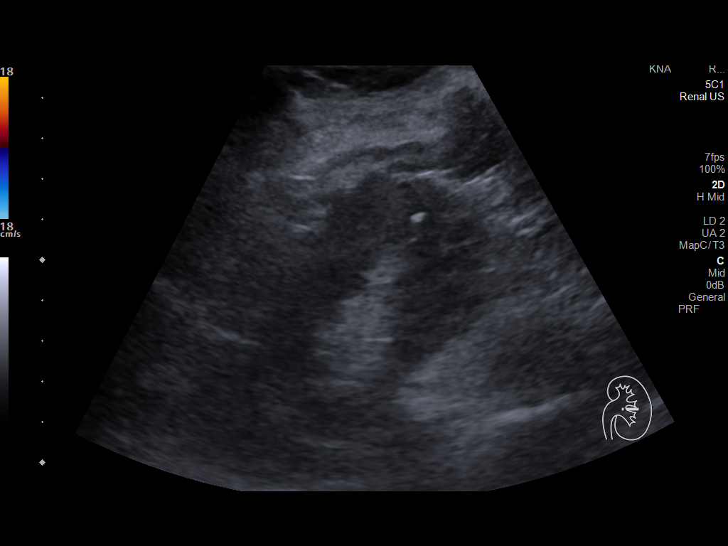
[im 61/73]
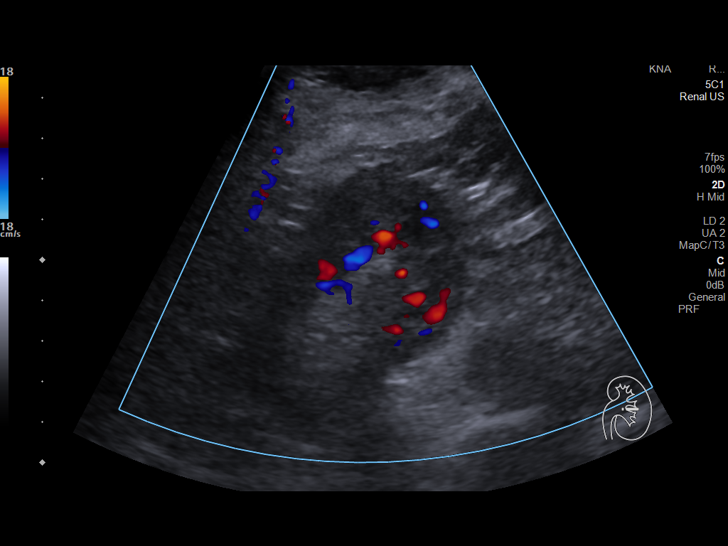
[im 67/73]
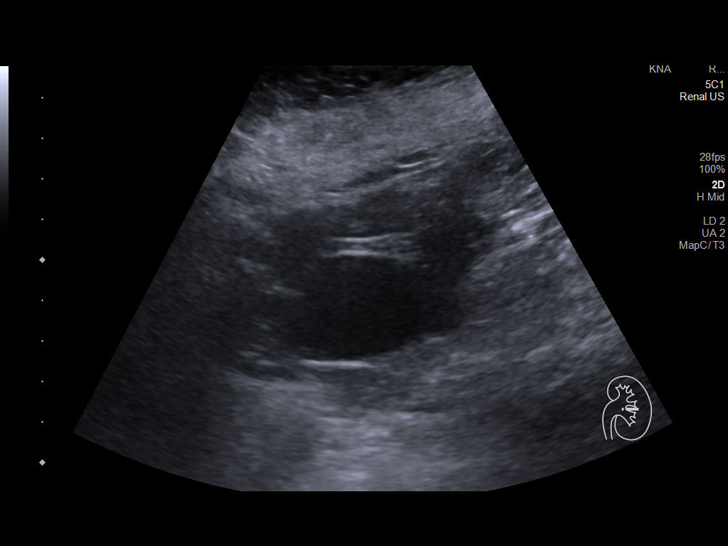
[im 73/73]
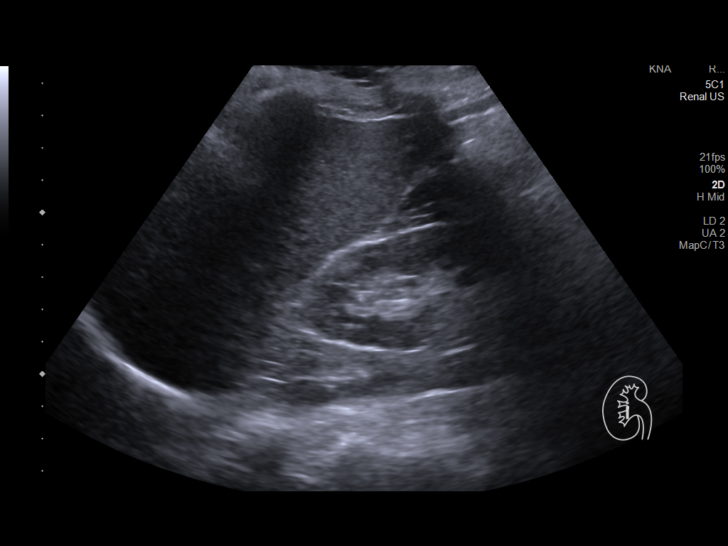

[13 of 25 positions shown; findings below may reference images not displayed]

FINDINGS: Right Kidney:

Renal measurements: 9.2 x 4.2 x 4.3 cm = volume: 87 mL. Slightly
increased renal parenchymal echogenicity. Minimally complex cyst in
the upper pole measures 1.5 x 1.0 x 1.0 cm. The cyst is largely
anechoic with posterior acoustic enhancement but demonstrates 1 thin
internal septation.

Left Kidney:

Renal measurements: 9.6 x 5.2 x 4.9 cm = volume: 128 mL. Increased
renal parenchymal echogenicity. Minimal fullness of the renal
collecting system slightly improved compared to prior. Stable the
proximally 8 mm echogenic focus in the upper pole consistent with
nephrolithiasis. Exophytic simple cyst arising from the lower pole
measures 3.2 x 2.2 x 3.0 cm.

Bladder:

Appears normal for degree of bladder distention.

Other:

None.
IMPRESSION: 1. Stable to slightly improved mild left hydronephrosis.
2. Echogenic renal parenchyma bilaterally suggests underlying
medical renal disease.
3. Probable left upper pole nonobstructing nephrolithiasis versus
parenchymal calcification.
4. Previously identified right-sided nephrolithiasis not seen on
today's study.
5. Bilateral renal cysts again noted.

## 2021-11-16 ENCOUNTER — Other Ambulatory Visit (HOSPITAL_COMMUNITY): Payer: Self-pay | Admitting: Internal Medicine

## 2021-11-16 DIAGNOSIS — M5416 Radiculopathy, lumbar region: Secondary | ICD-10-CM

## 2021-11-27 DIAGNOSIS — E663 Overweight: Secondary | ICD-10-CM | POA: Diagnosis not present

## 2021-11-27 DIAGNOSIS — M5481 Occipital neuralgia: Secondary | ICD-10-CM | POA: Diagnosis not present

## 2021-11-27 DIAGNOSIS — Z6828 Body mass index (BMI) 28.0-28.9, adult: Secondary | ICD-10-CM | POA: Diagnosis not present

## 2021-11-27 DIAGNOSIS — E063 Autoimmune thyroiditis: Secondary | ICD-10-CM | POA: Diagnosis not present

## 2021-11-27 DIAGNOSIS — I10 Essential (primary) hypertension: Secondary | ICD-10-CM | POA: Diagnosis not present

## 2021-11-27 DIAGNOSIS — M5416 Radiculopathy, lumbar region: Secondary | ICD-10-CM | POA: Diagnosis not present

## 2021-12-21 IMAGING — DX DG KNEE COMPLETE 4+V*R*
4 series · 4 of 4 positions shown · non-contrast
Comparison: None.

CLINICAL DATA: Osteoarthritis of the knee.  Knee pain.

EXAM:
RIGHT KNEE - COMPLETE 4+ VIEW

[knee ap]
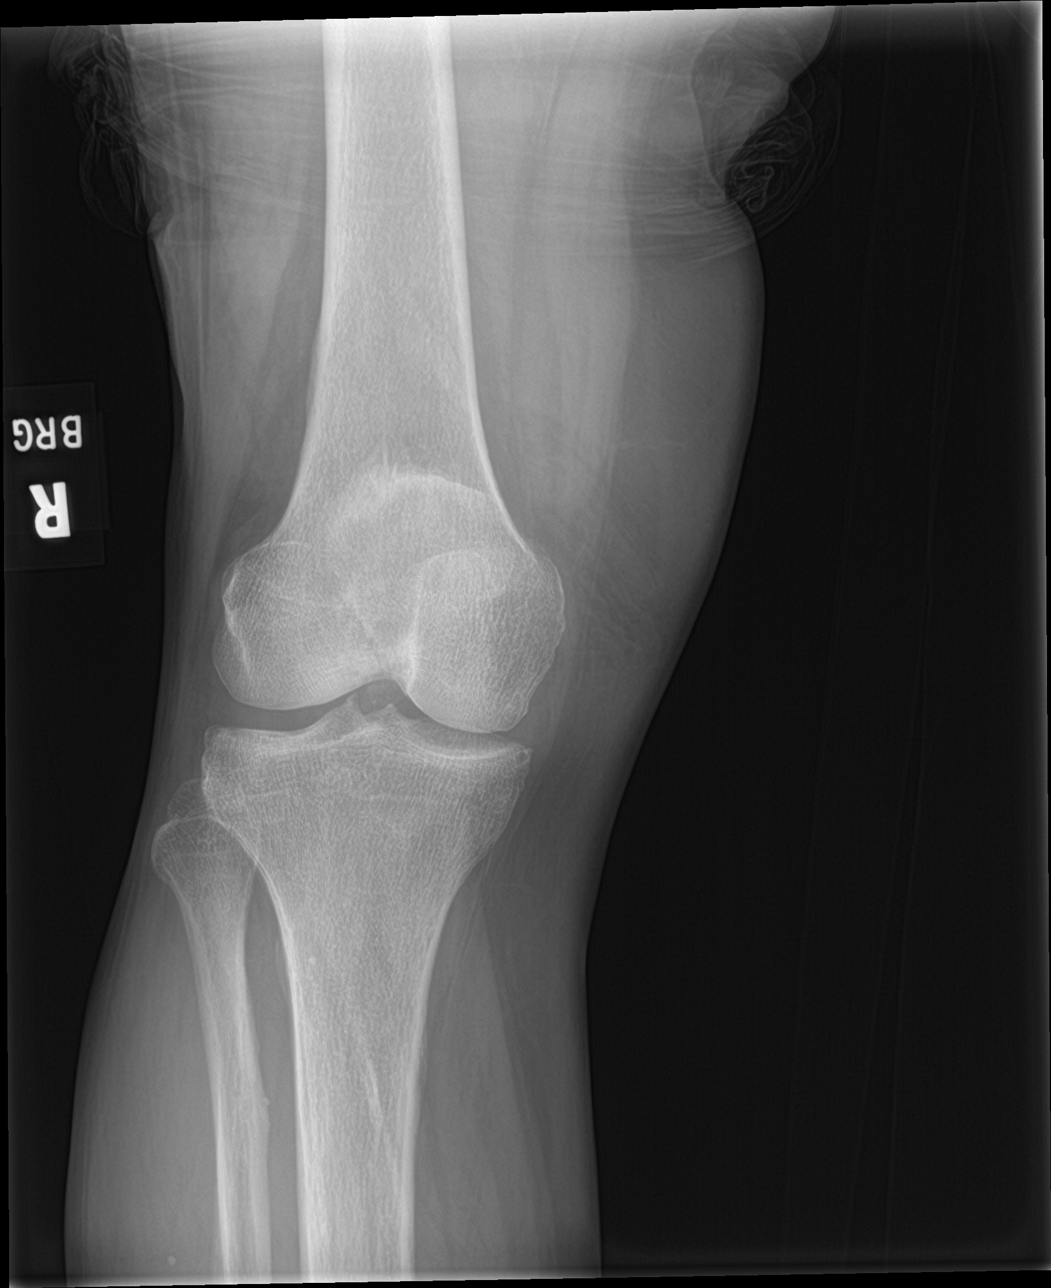

[knee obl (1 of 2)]
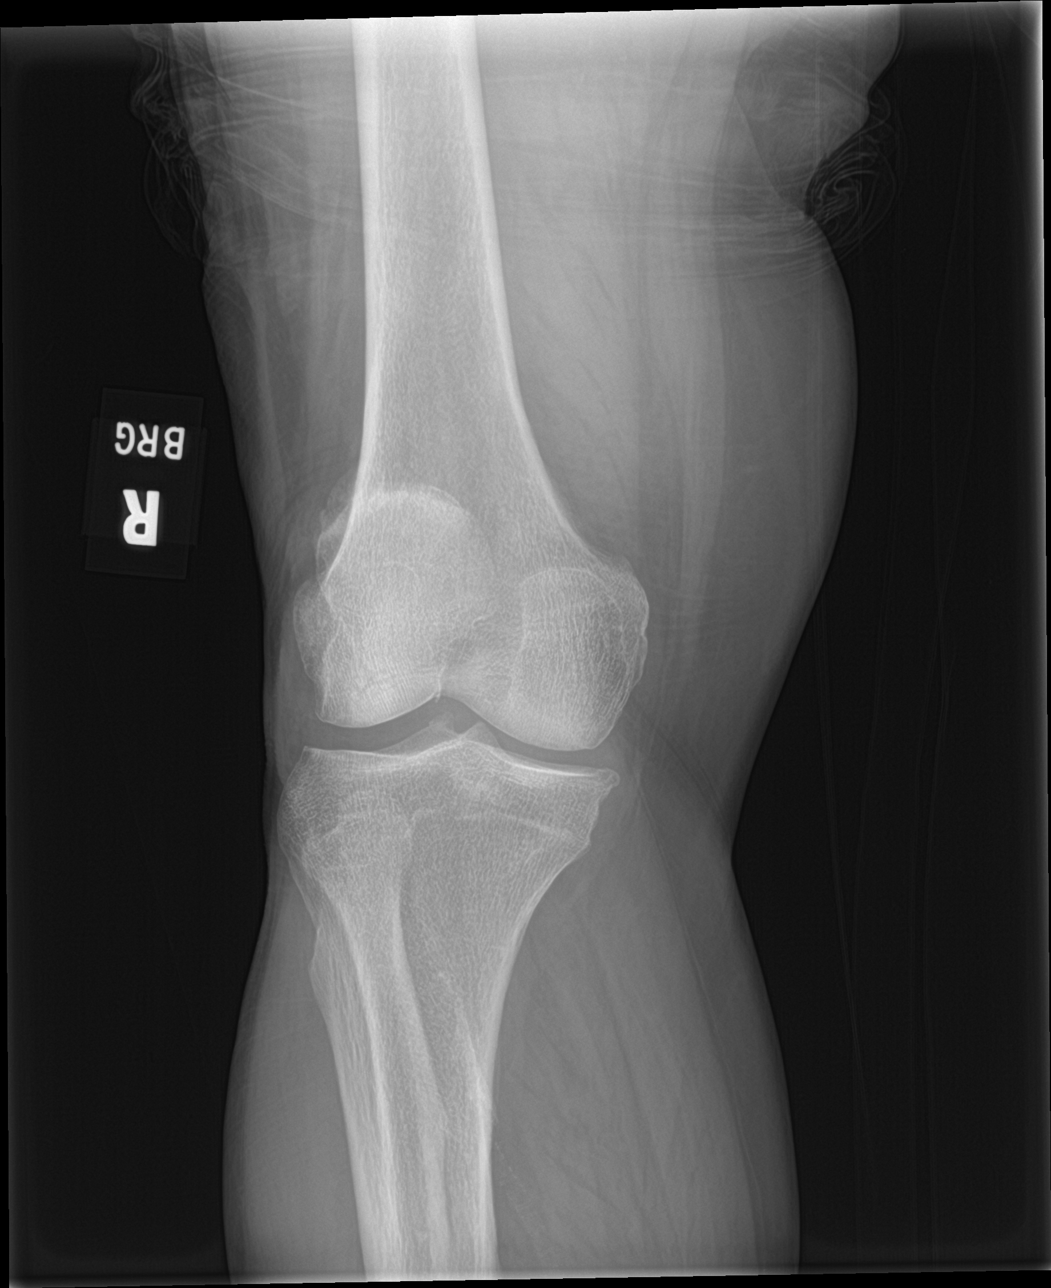

[knee obl (2 of 2)]
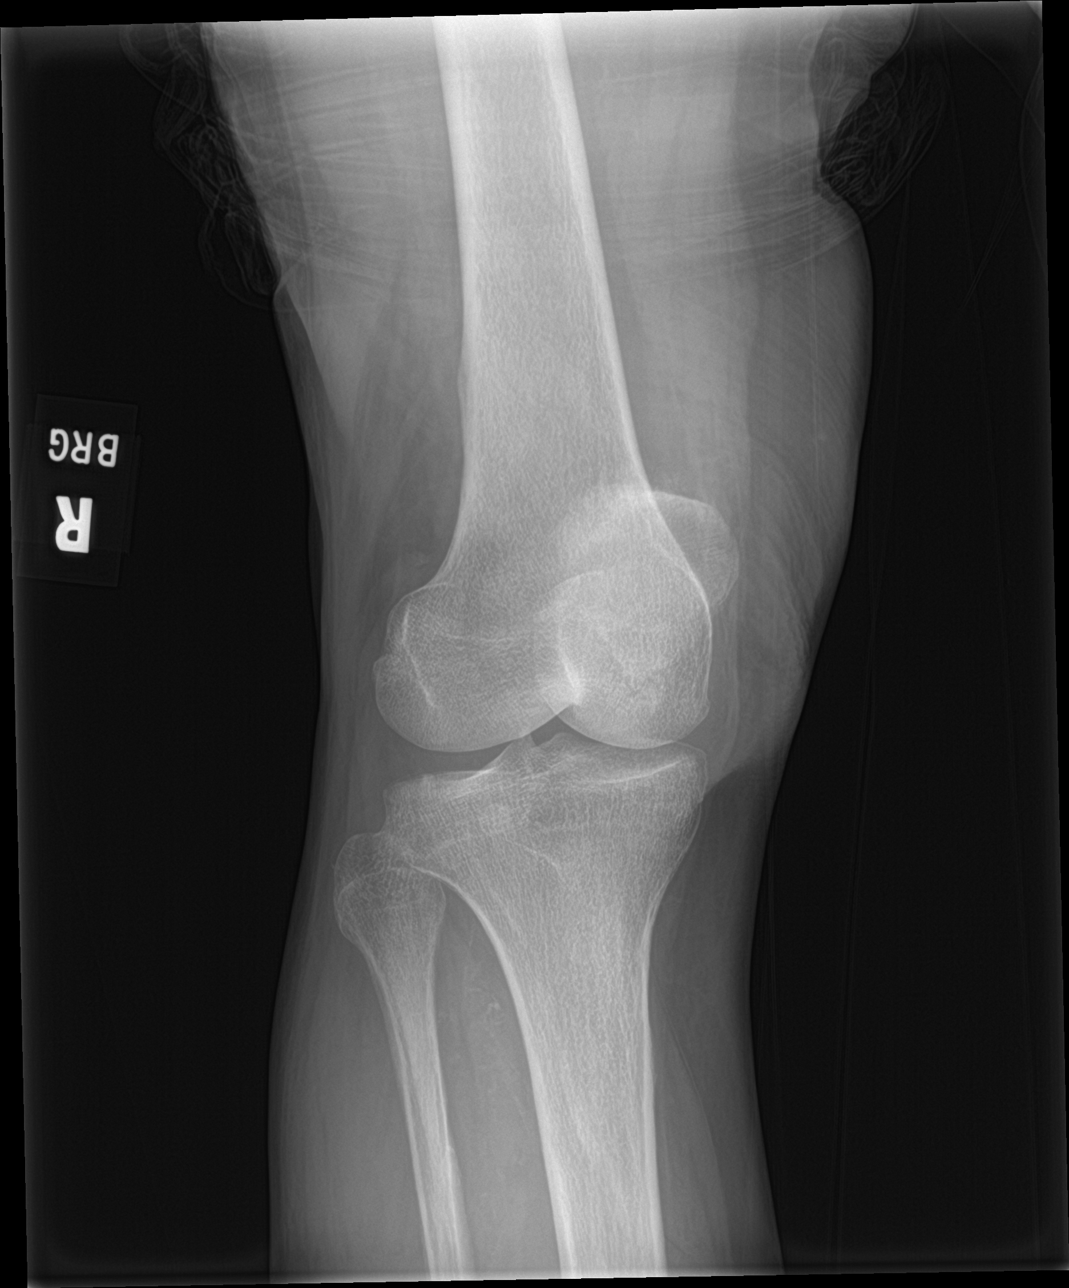

[knee lat]
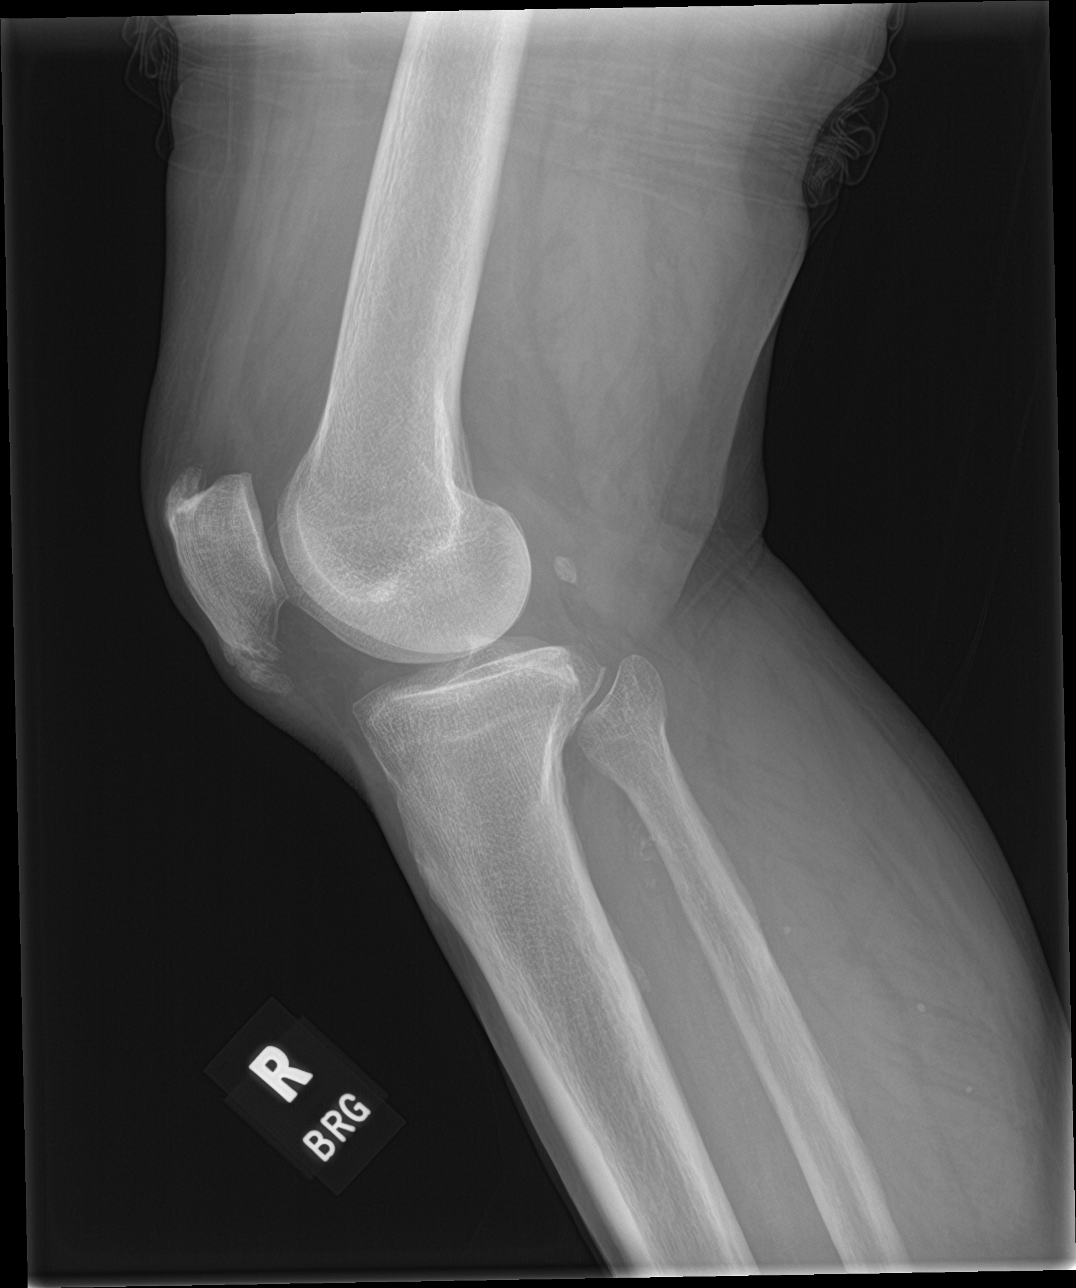

[4 of 4 positions shown; findings below may reference images not displayed]

FINDINGS: Joint effusion is present. There is no acute fracture or
dislocation. Superior and inferior patellar spurs are present. Joint
spaces are maintained. There are minimal degenerative osteophytes of
the medial compartment.
IMPRESSION: 1. Joint effusion.
2. Mild degenerative changes.

## 2022-02-04 DIAGNOSIS — S20462A Insect bite (nonvenomous) of left back wall of thorax, initial encounter: Secondary | ICD-10-CM | POA: Diagnosis not present

## 2022-02-04 DIAGNOSIS — F411 Generalized anxiety disorder: Secondary | ICD-10-CM | POA: Diagnosis not present

## 2022-02-04 DIAGNOSIS — Z6827 Body mass index (BMI) 27.0-27.9, adult: Secondary | ICD-10-CM | POA: Diagnosis not present

## 2022-02-04 DIAGNOSIS — E663 Overweight: Secondary | ICD-10-CM | POA: Diagnosis not present

## 2022-03-24 DIAGNOSIS — H5213 Myopia, bilateral: Secondary | ICD-10-CM | POA: Diagnosis not present

## 2022-05-21 ENCOUNTER — Ambulatory Visit: Payer: Medicare HMO | Admitting: Urology

## 2022-05-25 ENCOUNTER — Other Ambulatory Visit: Payer: Self-pay

## 2022-05-25 ENCOUNTER — Ambulatory Visit: Payer: Medicare HMO | Admitting: Urology

## 2022-05-25 DIAGNOSIS — N200 Calculus of kidney: Secondary | ICD-10-CM

## 2022-07-27 DIAGNOSIS — F411 Generalized anxiety disorder: Secondary | ICD-10-CM | POA: Diagnosis not present

## 2022-07-27 DIAGNOSIS — Z6826 Body mass index (BMI) 26.0-26.9, adult: Secondary | ICD-10-CM | POA: Diagnosis not present

## 2022-07-27 DIAGNOSIS — I10 Essential (primary) hypertension: Secondary | ICD-10-CM | POA: Diagnosis not present

## 2022-07-27 DIAGNOSIS — M792 Neuralgia and neuritis, unspecified: Secondary | ICD-10-CM | POA: Diagnosis not present

## 2022-07-27 DIAGNOSIS — N183 Chronic kidney disease, stage 3 unspecified: Secondary | ICD-10-CM | POA: Diagnosis not present

## 2022-07-27 DIAGNOSIS — I700 Atherosclerosis of aorta: Secondary | ICD-10-CM | POA: Diagnosis not present

## 2022-07-27 DIAGNOSIS — M5030 Other cervical disc degeneration, unspecified cervical region: Secondary | ICD-10-CM | POA: Diagnosis not present

## 2022-07-27 DIAGNOSIS — M5416 Radiculopathy, lumbar region: Secondary | ICD-10-CM | POA: Diagnosis not present

## 2022-11-18 DIAGNOSIS — D518 Other vitamin B12 deficiency anemias: Secondary | ICD-10-CM | POA: Diagnosis not present

## 2022-11-18 DIAGNOSIS — R7309 Other abnormal glucose: Secondary | ICD-10-CM | POA: Diagnosis not present

## 2022-11-18 DIAGNOSIS — N183 Chronic kidney disease, stage 3 unspecified: Secondary | ICD-10-CM | POA: Diagnosis not present

## 2022-11-18 DIAGNOSIS — F411 Generalized anxiety disorder: Secondary | ICD-10-CM | POA: Diagnosis not present

## 2022-11-18 DIAGNOSIS — Z0001 Encounter for general adult medical examination with abnormal findings: Secondary | ICD-10-CM | POA: Diagnosis not present

## 2022-11-18 DIAGNOSIS — M5030 Other cervical disc degeneration, unspecified cervical region: Secondary | ICD-10-CM | POA: Diagnosis not present

## 2022-11-18 DIAGNOSIS — Z1331 Encounter for screening for depression: Secondary | ICD-10-CM | POA: Diagnosis not present

## 2022-11-18 DIAGNOSIS — E559 Vitamin D deficiency, unspecified: Secondary | ICD-10-CM | POA: Diagnosis not present

## 2022-11-18 DIAGNOSIS — G9332 Myalgic encephalomyelitis/chronic fatigue syndrome: Secondary | ICD-10-CM | POA: Diagnosis not present

## 2022-11-18 DIAGNOSIS — I700 Atherosclerosis of aorta: Secondary | ICD-10-CM | POA: Diagnosis not present

## 2022-11-18 DIAGNOSIS — I10 Essential (primary) hypertension: Secondary | ICD-10-CM | POA: Diagnosis not present

## 2022-11-18 DIAGNOSIS — M792 Neuralgia and neuritis, unspecified: Secondary | ICD-10-CM | POA: Diagnosis not present

## 2022-11-18 DIAGNOSIS — M5416 Radiculopathy, lumbar region: Secondary | ICD-10-CM | POA: Diagnosis not present

## 2022-11-18 DIAGNOSIS — E782 Mixed hyperlipidemia: Secondary | ICD-10-CM | POA: Diagnosis not present

## 2023-05-27 ENCOUNTER — Ambulatory Visit: Payer: Medicare HMO | Admitting: Urology

## 2023-06-24 ENCOUNTER — Ambulatory Visit (HOSPITAL_COMMUNITY)
Admission: RE | Admit: 2023-06-24 | Discharge: 2023-06-24 | Disposition: A | Discharge status: Home / Self Care | Payer: Medicare HMO | Source: Ambulatory Visit | Attending: Urology

## 2023-06-24 ENCOUNTER — Other Ambulatory Visit: Payer: Self-pay

## 2023-06-24 DIAGNOSIS — N200 Calculus of kidney: Secondary | ICD-10-CM

## 2023-06-24 DIAGNOSIS — Z87442 Personal history of urinary calculi: Secondary | ICD-10-CM | POA: Diagnosis not present

## 2023-07-01 ENCOUNTER — Ambulatory Visit (INDEPENDENT_AMBULATORY_CARE_PROVIDER_SITE_OTHER): Payer: Medicare HMO | Admitting: Urology

## 2023-07-01 VITALS — BP 150/85 | HR 79

## 2023-07-01 DIAGNOSIS — N200 Calculus of kidney: Secondary | ICD-10-CM

## 2023-07-05 NOTE — Progress Notes (Signed)
Patient rescheduled due to imaging

## 2023-08-10 DIAGNOSIS — Z6826 Body mass index (BMI) 26.0-26.9, adult: Secondary | ICD-10-CM | POA: Diagnosis not present

## 2023-08-10 DIAGNOSIS — N183 Chronic kidney disease, stage 3 unspecified: Secondary | ICD-10-CM | POA: Diagnosis not present

## 2023-08-10 DIAGNOSIS — R7309 Other abnormal glucose: Secondary | ICD-10-CM | POA: Diagnosis not present

## 2023-08-10 DIAGNOSIS — I10 Essential (primary) hypertension: Secondary | ICD-10-CM | POA: Diagnosis not present

## 2023-08-10 DIAGNOSIS — F411 Generalized anxiety disorder: Secondary | ICD-10-CM | POA: Diagnosis not present

## 2023-08-10 DIAGNOSIS — M5416 Radiculopathy, lumbar region: Secondary | ICD-10-CM | POA: Diagnosis not present

## 2023-08-10 DIAGNOSIS — F32A Depression, unspecified: Secondary | ICD-10-CM | POA: Diagnosis not present

## 2023-09-09 ENCOUNTER — Ambulatory Visit (HOSPITAL_COMMUNITY)
Admission: RE | Admit: 2023-09-09 | Discharge: 2023-09-09 | Disposition: A | Discharge status: Home / Self Care | Payer: Medicare HMO | Source: Ambulatory Visit | Attending: Urology | Admitting: Urology

## 2023-09-09 DIAGNOSIS — N200 Calculus of kidney: Secondary | ICD-10-CM | POA: Diagnosis not present

## 2023-09-09 DIAGNOSIS — N1330 Unspecified hydronephrosis: Secondary | ICD-10-CM | POA: Diagnosis not present

## 2023-09-09 DIAGNOSIS — N281 Cyst of kidney, acquired: Secondary | ICD-10-CM | POA: Diagnosis not present

## 2023-09-16 ENCOUNTER — Ambulatory Visit: Payer: Medicare HMO | Admitting: Urology

## 2023-09-16 VITALS — BP 153/74 | HR 85

## 2023-09-16 DIAGNOSIS — N200 Calculus of kidney: Secondary | ICD-10-CM

## 2023-09-16 DIAGNOSIS — N3001 Acute cystitis with hematuria: Secondary | ICD-10-CM | POA: Diagnosis not present

## 2023-09-16 LAB — URINALYSIS, ROUTINE W REFLEX MICROSCOPIC
Bilirubin, UA: NEGATIVE
Glucose, UA: NEGATIVE
Ketones, UA: NEGATIVE
Nitrite, UA: NEGATIVE
Protein,UA: NEGATIVE
Specific Gravity, UA: >1.03 — ABNORMAL HIGH (ref 1.005–1.030)
Urobilinogen, Ur: 0.2 mg/dL (ref 0.2–1.0)
pH, UA: 5.5 (ref 5.0–7.5)

## 2023-09-16 LAB — MICROSCOPIC EXAMINATION

## 2023-09-16 NOTE — Progress Notes (Signed)
09/16/2023 12:59 PM   Claire Barnett Oct 09, 86 1937 [id number]  Referring provider: Elfredia Nevins, MD [address] [place],  [place] [address]  Followup nephrolithiasis   HPI: Claire Barnett is a 88yo here for followup for nephrolithiasis. She denies any flank pain. Renal US shows left mild pelvis fullness. She denies any stone events since last visit. She denies any worsening LUTS. NO hematuria or dysuria   PMH: Past Medical History:  Diagnosis Date   Anxiety    History of kidney stones    Hypertension    S/P colonoscopy August 2007   Rare sigmoid diverticulosis.  Otherwise, no polyps, inflammatory    Surgical History: Past Surgical History:  Procedure Laterality Date   ABDOMINAL HYSTERECTOMY     BREAST LUMPECTOMY Right    benign   CATARACT EXTRACTION W/PHACO Right 02/01/2013   Procedure: CATARACT EXTRACTION PHACO AND INTRAOCULAR LENS PLACEMENT (IOC);  Surgeon: Gemma Payor, MD;  Location: [facility];  Service: Ophthalmology;  Laterality: Right;  CDE:25.80   COLONOSCOPY  05/12/2011   Procedure: COLONOSCOPY;  Surgeon: Arlyce Harman, MD;  Location: [facility];  Service: Endoscopy;  Laterality: N/A;  10:30AM   CYSTOSCOPY W/ STONE MANIPULATION     x3   IR NEPHROSTOMY PLACEMENT LEFT  08/15/2020   kidney stones     X 4 procedures   NEPHROLITHOTOMY Left 08/18/2020   Procedure: NEPHROLITHOTOMY PERCUTANEOUS.;  Surgeon: Malen Gauze, MD;  Location: [facility];  Service: Urology;  Laterality: Left;    Home Medications:  Allergies as of 09/16/2023   No Known Allergies      Medication List        Accurate as of September 16, 2023 12:59 PM. If you have any questions, ask your nurse or doctor.          ALPRAZolam 0.5 MG tablet Commonly known as: XANAX Take 0.5-1 mg by mouth 3 (three) times daily.   cholecalciferol 1000 units tablet Commonly known as: VITAMIN D Take 1,000 Units by mouth daily.   gabapentin 100 MG capsule Commonly known as: NEURONTIN Take 100 mg by  mouth 3 (three) times daily.   hydrochlorothiazide 25 MG tablet Commonly known as: HYDRODIURIL Take 25 mg by mouth every morning.        Allergies: No Known Allergies  Family History: Family History  Problem Relation Age of Onset   Colon cancer Sister        diagnosed late 30s.    Colon cancer Mother        diagnosed in mid to late 61s. Deceased age 36    Social History:  reports that she has never smoked. She has never used smokeless tobacco. She reports that she does not drink alcohol and does not use drugs.  ROS: All other review of systems were reviewed and are negative except what is noted above in HPI  Physical Exam: BP (!) 153/74   Pulse 85   Constitutional:  Alert and oriented, No acute distress. HEENT: [place] AT, moist mucus membranes.  Trachea midline, no masses. Cardiovascular: No clubbing, cyanosis, or edema. Respiratory: Normal respiratory effort, no increased work of breathing. GI: Abdomen is soft, nontender, nondistended, no abdominal masses GU: No CVA tenderness.  Lymph: No cervical or inguinal lymphadenopathy. Skin: No rashes, bruises or suspicious lesions. Neurologic: Grossly intact, no focal deficits, moving all 4 extremities. Psychiatric: Normal mood and affect.  Laboratory Data: Lab Results  Component Value Date   WBC 8.6 08/19/2020   HGB 9.6 (L) 08/19/2020  HCT 30.1 (L) 08/19/2020   MCV 94.1 08/19/2020   PLT 190 08/19/2020    Lab Results  Component Value Date   CREATININE 0.87 08/19/2020    No results found for: "PSA"  No results found for: "TESTOSTERONE"  No results found for: "HGBA1C"  Urinalysis    Component Value Date/Time   COLORURINE AMBER (A) 09/07/2013 2045   APPEARANCEUR Clear 05/22/2021 1240   LABSPEC 1.025 09/07/2013 2045   PHURINE 6.5 09/07/2013 2045   GLUCOSEU Negative 05/22/2021 1240   HGBUR LARGE (A) 09/07/2013 2045   BILIRUBINUR Negative 05/22/2021 1240   KETONESUR 15 (A) 09/07/2013 2045   PROTEINUR Negative  05/22/2021 1240   PROTEINUR >300 (A) 09/07/2013 2045   UROBILINOGEN 0.2 09/07/2013 2045   NITRITE Negative 05/22/2021 1240   NITRITE NEGATIVE 09/07/2013 2045   LEUKOCYTESUR Trace (A) 05/22/2021 1240    Lab Results  Component Value Date   LABMICR See below: 05/22/2021   WBCUA 0-5 05/22/2021   LABEPIT 0-10 05/22/2021   MUCUS Present 11/18/2020   BACTERIA Few (A) 05/22/2021    Pertinent Imaging: Renal US 09/09/23: Images reviewed and discussed with the patient Results for orders placed in visit on 06/24/23  DG Abd 1 View  Narrative CLINICAL DATA:  History of left renal calculus  EXAM: ABDOMEN - 1 VIEW  COMPARISON:  CT 08/26/2020 hip radiograph 04/15/2021  FINDINGS: Nonobstructed gas pattern with moderate stool. No radiopaque calculi over the kidneys. Small pelvic calcifications are presumably due to phleboliths.  IMPRESSION: No radiopaque calculi over the kidneys.   Electronically Signed By: Jasmine Pang M.D. On: 07/17/2023 18:34  No results found for this or any previous visit.  No results found for this or any previous visit.  No results found for this or any previous visit.  Results for orders placed in visit on 06/24/23  Ultrasound renal complete  Narrative CLINICAL DATA:  Renal calculus follow-up.  EXAM: RENAL / URINARY TRACT ULTRASOUND COMPLETE  COMPARISON:  May 27, 2021  FINDINGS: Right Kidney:  Renal measurements: 9.5 x 9 x 5.4 cm = volume: 107.2 mL. Echogenicity within normal limits. No hydronephrosis visualized. Right kidney cysts are identified, largest measures 1.8 x 1.5 x 1.6 cm. No follow-up is recommended.  Left Kidney:  Renal measurements: 9.2 x 5.2 x 4.8 cm = volume: 118.8 mL. Echogenicity within normal limits. There is mild left hydronephrosis. There is a 4.1 cm simple cyst in the left kidney lower pole, no follow-up is recommended.  Bladder:  Appears normal for degree of bladder  distention.  Other:  None.  IMPRESSION: Mild left hydronephrosis.  No definite kidney stones identified.   Electronically Signed By: Sherian Rein M.D. On: 09/09/2023 13:03  No results found for this or any previous visit.  No results found for this or any previous visit.  Results for orders placed during the hospital encounter of 08/26/20  CT RENAL STONE STUDY  Narrative CLINICAL DATA:  Intermittent left flank pain. Gross hematuria. Prior percutaneous nephrolithotomy on 08/18/2020  EXAM: CT ABDOMEN AND PELVIS WITHOUT CONTRAST  TECHNIQUE: Multidetector CT imaging of the abdomen and pelvis was performed following the standard protocol without IV contrast.  COMPARISON:  07/16/2020  FINDINGS: Lower chest: Bandlike scarring extending sagittally in the left lower lobe. Mild scarring medially in the right middle lobe. Left anterior descending, right, and circumflex coronary artery atherosclerosis.  Hepatobiliary: Unremarkable  Pancreas: Unremarkable  Spleen: Unremarkable  Adrenals/Urinary Tract: Mild fullness of the left adrenal gland without discrete mass. There are about  three nonobstructive right renal calculi in the 1-2 mm size range. Small hypodense lesions in the right mid kidney are again observed, the larger measuring about 1.2 cm in diameter, most likely a cyst.  Interval removal of the large left staghorn calculus. Left percutaneous nephrostomy catheter is in place along with a small amount of gas in the collecting system. There is also a left double-J ureteral stent in place. Borderline left hydronephrosis. No substantial hydroureter. Small amount of gas in the urinary bladder. No significant residual left nephrolithiasis. No ureteral stones on the left along side the stent.  Exophytic 3.1 by 2.7 cm fluid density left kidney lower pole lesion favoring cyst.  Stomach/Bowel: Scattered sigmoid colon diverticula. Prominent stool throughout the colon  favors constipation.  Vascular/Lymphatic: Aortoiliac atherosclerotic vascular disease.  Reproductive: Uterus absent. Adnexa unremarkable.  Other: No supplemental non-categorized findings.  Musculoskeletal: Lower lumbar spondylosis and degenerative disc disease with grade 1 anterolisthesis at L4-5 and L5-S1 and suspected multilevel impingement.  IMPRESSION: 1. Interval removal of the large left staghorn calculus with a left percutaneous nephrostomy catheter and left double-J ureteral stent in place. Borderline left hydronephrosis. No significant residual left nephrolithiasis. 2. Tiny nonobstructive right renal calculi. 3. Other imaging findings of potential clinical significance: Coronary atherosclerosis. Prominent stool throughout the colon favors constipation. Scattered sigmoid colon diverticula. Lower lumbar spondylosis and degenerative disc disease causing multilevel impingement. 4. Aortic atherosclerosis.  Aortic Atherosclerosis (ICD10-I70.0).   Electronically Signed By: Gaylyn Rong M.D. On: 08/27/2020 08:04   Assessment & Plan:    1. Renal calculus, left (Primary) Followup 1 year with renal US - Urinalysis, Routine w reflex microscopic  2. Acute cystitis with hematuria Urine for culture, will call with results   No follow-ups on file.  Wilkie Aye, MD  [facility] [place]

## 2023-09-20 LAB — URINE CULTURE

## 2023-09-23 ENCOUNTER — Telehealth: Payer: Self-pay

## 2023-09-23 MED ORDER — AMOXICILLIN-POT CLAVULANATE 875-125 MG PO TABS: 1.0000 | ORAL_TABLET | Freq: Two times a day (BID) | ORAL | 0 refills | Status: AC

## 2023-09-23 NOTE — Telephone Encounter (Signed)
Patient called and made aware of positive urine culture and Augmentin sent to pharmacy. Patient voiced understanding,

## 2023-09-26 ENCOUNTER — Encounter: Payer: Self-pay | Admitting: Urology

## 2023-09-26 NOTE — Patient Instructions (Signed)

## 2024-01-12 ENCOUNTER — Encounter (HOSPITAL_COMMUNITY): Payer: Self-pay | Admitting: Internal Medicine

## 2024-01-12 DIAGNOSIS — N644 Mastodynia: Secondary | ICD-10-CM | POA: Diagnosis not present

## 2024-01-12 DIAGNOSIS — F411 Generalized anxiety disorder: Secondary | ICD-10-CM | POA: Diagnosis not present

## 2024-01-12 DIAGNOSIS — I10 Essential (primary) hypertension: Secondary | ICD-10-CM | POA: Diagnosis not present

## 2024-01-12 DIAGNOSIS — E039 Hypothyroidism, unspecified: Secondary | ICD-10-CM | POA: Diagnosis not present

## 2024-01-12 DIAGNOSIS — E538 Deficiency of other specified B group vitamins: Secondary | ICD-10-CM | POA: Diagnosis not present

## 2024-01-12 DIAGNOSIS — N183 Chronic kidney disease, stage 3 unspecified: Secondary | ICD-10-CM | POA: Diagnosis not present

## 2024-01-12 DIAGNOSIS — E663 Overweight: Secondary | ICD-10-CM | POA: Diagnosis not present

## 2024-01-12 DIAGNOSIS — M5030 Other cervical disc degeneration, unspecified cervical region: Secondary | ICD-10-CM | POA: Diagnosis not present

## 2024-01-12 DIAGNOSIS — M792 Neuralgia and neuritis, unspecified: Secondary | ICD-10-CM | POA: Diagnosis not present

## 2024-01-12 DIAGNOSIS — M5416 Radiculopathy, lumbar region: Secondary | ICD-10-CM | POA: Diagnosis not present

## 2024-01-12 DIAGNOSIS — R109 Unspecified abdominal pain: Secondary | ICD-10-CM | POA: Diagnosis not present

## 2024-01-16 ENCOUNTER — Other Ambulatory Visit (HOSPITAL_COMMUNITY): Payer: Self-pay | Admitting: Internal Medicine

## 2024-01-16 DIAGNOSIS — N644 Mastodynia: Secondary | ICD-10-CM

## 2024-01-17 ENCOUNTER — Ambulatory Visit (HOSPITAL_COMMUNITY)
Admission: RE | Admit: 2024-01-17 | Discharge: 2024-01-17 | Disposition: A | Discharge status: Home / Self Care | Source: Ambulatory Visit | Attending: Internal Medicine | Admitting: Internal Medicine

## 2024-01-17 DIAGNOSIS — N644 Mastodynia: Secondary | ICD-10-CM | POA: Insufficient documentation

## 2024-01-17 DIAGNOSIS — R92333 Mammographic heterogeneous density, bilateral breasts: Secondary | ICD-10-CM | POA: Diagnosis not present

## 2024-01-23 ENCOUNTER — Encounter: Payer: Self-pay | Admitting: Gastroenterology

## 2024-01-23 NOTE — Progress Notes (Unsigned)
 Referring Provider:Fusco, Salena Craven, MD Primary Care Physician:  Kathyleen Parkins, MD Primary Gastroenterologist:  Dr. Nolene Baumgarten previously, establishing with Dr. Mordechai April  Chief Complaint  Patient presents with   Abdominal Pain    Abdominal pain and looks like the left side of her stomach is bigger than the right.     HPI:   Claire Barnett is a 88 y.o. female presenting today at the request of Kathyleen Parkins, MD for abdominal bloating.    Today: Upper abdomen seems more prominent than lower abdomen. Present for the last 6 months. No abdominal pain.  Just wanted to get it checked out to make sure nothing was going on.  Bowels moving well. Most every day. No diarrhea. Had diarrhea about 3 weeks ago, but this resolved. No brbpr or melena. No unintentional weight loss.   Mild nausea when waking up in the morning. Very short lived. Will be gone before she gets out of bed.  No heartburn/reflux.  No dysphagia.   No NSAIDs. Only taking tylenol .    Labs 01/12/2024 (included in referral): CBC within normal limits, CMP within normal limits aside from creatinine 1.23, B12, folate, TSH, vitamin D  within normal limits.  Colonoscopy 05/12/2011 by Dr. Nolene Baumgarten for personal history of colon polyps and 2 first-degree relatives with colon cancer age less than 6.  With moderate diverticulosis, small internal hemorrhoids.  Recommended surveillance in 5-10 years.  Past Medical History:  Diagnosis Date   Anxiety    CKD (chronic kidney disease)    History of kidney stones    Hypertension    Osteoarthritis    S/P colonoscopy 03/30/2006   Rare sigmoid diverticulosis.  Otherwise, no polyps, inflammatory    Past Surgical History:  Procedure Laterality Date   ABDOMINAL HYSTERECTOMY     BREAST LUMPECTOMY Right    benign   CATARACT EXTRACTION W/PHACO Right 02/01/2013   Procedure: CATARACT EXTRACTION PHACO AND INTRAOCULAR LENS PLACEMENT (IOC);  Surgeon: Anner Kill, MD;  Location: [facility];  Service:  Ophthalmology;  Laterality: Right;  CDE:25.80   COLONOSCOPY  05/12/2011   Procedure: COLONOSCOPY;  Surgeon: Pauleen Borne, MD;  [facility]: [facility];  Service: Endoscopy;  Laterality: N/A;  10:30AM   CYSTOSCOPY W/ STONE MANIPULATION     x3   IR NEPHROSTOMY PLACEMENT LEFT  08/15/2020   kidney stones     X 4 procedures   NEPHROLITHOTOMY Left 08/18/2020   Procedure: NEPHROLITHOTOMY PERCUTANEOUS.;  Surgeon: Marco Severs, MD;  Location: [facility];  Service: Urology;  Laterality: Left;    Current Outpatient Medications  Medication Sig Dispense Refill   ALPRAZolam  (XANAX ) 0.5 MG tablet Take 0.5-1 mg by mouth 3 (three) times daily.     cholecalciferol  (VITAMIN D ) 1000 UNITS tablet Take 1,000 Units by mouth daily.     escitalopram (LEXAPRO) 10 MG tablet Take 10 mg by mouth daily.     gabapentin  (NEURONTIN ) 100 MG capsule Take 100 mg by mouth 3 (three) times daily.     hydrochlorothiazide  25 MG tablet Take 25 mg by mouth every morning.     traZODone (DESYREL) 50 MG tablet Take 50 mg by mouth at bedtime.     No current facility-administered medications for this visit.    Allergies as of 01/25/2024   (No Known Allergies)    Family History  Problem Relation Age of Onset   Colon cancer Sister        diagnosed late 69s.    Colon cancer Mother  diagnosed in mid to late 83s. 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  Tobacco Use   Smoking status: Never   Smokeless tobacco: Never  Vaping Use   Vaping status: Never Used  Substance and Sexual Activity   Alcohol use: No   Drug use: No   Sexual activity: Yes    Birth control/protection: Surgical  Other Topics Concern   Not on file  Social History  Narrative   Not on file   Social Drivers of Health   Financial Resource Strain: Not on file  Food Insecurity: Not on file  Transportation Needs: Not on file  Physical Activity: Not on file  Stress: Not on file  Social Connections: Unknown (01/11/2022)   Received from [facility]   Social Network    Social Network: Not on file  Intimate Partner Violence: Unknown (12/03/2021)   Received from [facility], [facility]   HITS    Physically Hurt: Not on file    Insult or Talk Down To: Not on file    Threaten Physical Harm: Not on file    Scream or Curse: Not on file    Review of Systems: Gen: Denies any fever, chills, fatigue, cold or flu like symptoms,  CV: Denies chest pain, heart palpitations.  Resp: Denies shortness of breath, cough.  GI: See HPI GU : Denies urinary burning, urinary frequency, urinary hesitancy MS: Denies joint pain, muscle weakness.  Derm: Denies rash.  Psych: Denies depression, anxiety.  Heme: See HPI  Physical Exam: BP 136/75 (BP Location: Right Arm, Patient Position: Sitting, Cuff Size: Normal)   Pulse 71   Temp 97.6 F (36.4 C) (Temporal)   Ht 5' (1.524 m)   Wt 141 lb 3.2 oz (64 kg)   BMI 27.58 kg/m  General:   Alert and oriented. Pleasant and cooperative. Well-nourished and well-developed.  Head:  Normocephalic and atraumatic.  Eyes:  Without icterus, sclera clear and conjunctiva pink.  Ears:  Normal auditory acuity. Lungs:  Clear to auscultation bilaterally. No wheezes, rales, or rhonchi. No distress.  Heart:  S1, S2 present without murmurs appreciated.  Abdomen:  Upper abdomen is more prominent than lower abdomen. +BS, soft, and non-distended. Mild TTP in the epigastric and LUQ region. No HSM noted. No guarding or rebound. No masses appreciated.  Rectal:  Deferred  Msk:  Symmetrical without gross deformities. Normal posture. Extremities:  Without edema. Neurologic:  Alert and  oriented x4;  grossly normal  neurologically. Skin:  Intact without significant lesions or rashes. Psych:  Normal mood and affect.    Assessment:  88 year old female with history of anxiety, HTN, CKD, presenting today for further evaluation of abdominal asymmetry with upper abdomen being more prominent than lower abdomen for the last 6 months.  Denies associated abdominal pain.  Notes vague morning nausea that resolves spontaneously within a few minutes, but no other significant GI symptoms.  Denies weight loss. On exam, upper abdomen is clearly more prominent than lower abdomen and she does have some mild TTP in epigastric and LUQ region.  Reviewed recent labs 01/12/2024 (including referral) with no significant abnormalities on CBC, CMP.  Etiology is unclear. Will arrange CT A/P for further evaluation, rule out splenomegaly, hepatomegaly, underlying abdominal mass.    History of colon polyps/family history of colon cancer: Last colonoscopy in September 2012 with moderate diverticulosis, small internal hemorrhoids and recommended surveillance in 5-10 years, but this was never completed.  Patient currently has no significant lower GI symptoms or alarm symptoms.  We discussed the possibility of 1 more colonoscopy, risk and benefits of this at her age, and patient states she will think about this.  Plan:  CT A/P with contrast.  If CT is unrevealing, could consider course of daily PPI for 8 weeks to see if this would resolve morning nausea/epigastric and LUQ tenderness.  Patient will let me know if she wants to proceed with colonoscopy.  Follow-up TBD.    Shana Daring, PA-C [facility] 01/25/2024

## 2024-01-25 ENCOUNTER — Ambulatory Visit: Admitting: Gastroenterology

## 2024-01-25 ENCOUNTER — Telehealth: Payer: Self-pay | Admitting: *Deleted

## 2024-01-25 ENCOUNTER — Encounter: Payer: Self-pay | Admitting: Gastroenterology

## 2024-01-25 VITALS — BP 136/75 | HR 71 | Temp 97.6°F | Ht 60.0 in | Wt 141.2 lb

## 2024-01-25 DIAGNOSIS — R10812 Left upper quadrant abdominal tenderness: Secondary | ICD-10-CM

## 2024-01-25 DIAGNOSIS — R198 Other specified symptoms and signs involving the digestive system and abdomen: Secondary | ICD-10-CM

## 2024-01-25 DIAGNOSIS — Z860100 Personal history of colon polyps, unspecified: Secondary | ICD-10-CM | POA: Diagnosis not present

## 2024-01-25 DIAGNOSIS — Z800 Family history of malignant neoplasm of digestive organs: Secondary | ICD-10-CM

## 2024-01-25 DIAGNOSIS — R110 Nausea: Secondary | ICD-10-CM

## 2024-01-25 DIAGNOSIS — R10816 Epigastric abdominal tenderness: Secondary | ICD-10-CM | POA: Diagnosis not present

## 2024-01-25 NOTE — Telephone Encounter (Signed)
 CT scheduled for 6/4, arrival 5:00pm  LMOVM

## 2024-01-25 NOTE — Telephone Encounter (Signed)
 PA approve via cohere for CT A/P Authorization #[id number] DOS:  01/25/2024 - 03/25/2024

## 2024-01-25 NOTE — Patient Instructions (Addendum)
 We will get you scheduled for a CT scan of your abdomen and pelvis to further evaluate your upper abdominal distension.   Let me know if you would like to have a colonoscopy.   Shana Daring, PA-C [facility]

## 2024-01-26 NOTE — Telephone Encounter (Signed)
 LMOVM to call back

## 2024-01-27 NOTE — Telephone Encounter (Signed)
 Pt aware of appt.

## 2024-02-01 ENCOUNTER — Ambulatory Visit: Payer: Self-pay | Admitting: Gastroenterology

## 2024-02-01 ENCOUNTER — Ambulatory Visit (HOSPITAL_COMMUNITY)
Admission: RE | Admit: 2024-02-01 | Discharge: 2024-02-01 | Disposition: A | Discharge status: Home / Self Care | Source: Ambulatory Visit | Attending: Gastroenterology | Admitting: Gastroenterology

## 2024-02-01 DIAGNOSIS — N281 Cyst of kidney, acquired: Secondary | ICD-10-CM | POA: Diagnosis not present

## 2024-02-01 DIAGNOSIS — K5730 Diverticulosis of large intestine without perforation or abscess without bleeding: Secondary | ICD-10-CM | POA: Diagnosis not present

## 2024-02-01 DIAGNOSIS — R198 Other specified symptoms and signs involving the digestive system and abdomen: Secondary | ICD-10-CM | POA: Diagnosis not present

## 2024-02-01 DIAGNOSIS — R10816 Epigastric abdominal tenderness: Secondary | ICD-10-CM | POA: Diagnosis not present

## 2024-02-01 DIAGNOSIS — R10812 Left upper quadrant abdominal tenderness: Secondary | ICD-10-CM | POA: Diagnosis not present

## 2024-02-01 DIAGNOSIS — R140 Abdominal distension (gaseous): Secondary | ICD-10-CM | POA: Diagnosis not present

## 2024-02-01 MED ORDER — IOHEXOL 300 MG/ML  SOLN
100.0000 mL | Freq: Once | INTRAMUSCULAR | Status: AC | PRN
  Administered 2024-02-01: 100 mL via INTRAVENOUS

## 2024-02-02 NOTE — Progress Notes (Signed)
 Called pt schedule appt in 4 weeks

## 2024-03-03 NOTE — Progress Notes (Unsigned)
 Referring Provider: Bertell Satterfield, MD Primary Care Physician:  Bertell Satterfield, MD Primary GI Physician: Dr. Cindie  Chief Complaint  Patient presents with   Follow-up    Follow up. Left side of stomach seems bigger than right side     HPI:   Claire Barnett is a 88 y.o. female presenting today for follow-up of abdominal wall asymmetry, upper abdominal tenderness, morning nausea.   Last seen in the office 01/25/2024 with concern for abdominal wall asymmetry with upper abdomen being more prominent at the lower abdomen x 6 months.  No abdominal pain.  She had vague morning nausea that resolved spontaneously within a few minutes of waking, but no other GI symptoms.  Denied weight loss.  On exam, upper abdomen was clearly more prominent in lower abdomen and she had mild TTP in epigastric and LUQ region.  Recent outside labs from 01/12/2024 with no significant abnormalities on CBC or CMP.  Recommended CT A/P for further evaluation.  Discussed pursuing 1 more colonoscopy as she was overdue, but patient stated she would think about this.  CT A/P with contrast 02/01/24 with no acute findings.  Sigmoid diverticulosis.  Moderate stool throughout the colon. Recommended MiraLAX daily.   Today:  Nausea seemed to resolve in its own. No abdominal pain. No brbpr, melena. Some incomplete bowel movements and skipping a day or so. Drinks 2 bottles of water per day. Eats fruits and vegetables pretty good. Took MiraLAX daily for a little while and it didn't seem to help. Stools are not hard.   Doesn't want to do a colonoscopy unless absolutely necessary.    Colonoscopy 05/12/2011 by Dr. Harvey for personal history of colon polyps and 2 first-degree relatives with colon cancer age less than 55. With moderate diverticulosis, small internal hemorrhoids. Recommended surveillance in 5-10 years.    Past Medical History:  Diagnosis Date   Anxiety    CKD (chronic kidney disease)    History of kidney stones     Hypertension    Osteoarthritis    S/P colonoscopy 03/30/2006   Rare sigmoid diverticulosis.  Otherwise, no polyps, inflammatory    Past Surgical History:  Procedure Laterality Date   ABDOMINAL HYSTERECTOMY     BREAST LUMPECTOMY Right    benign   CATARACT EXTRACTION W/PHACO Right 02/01/2013   Procedure: CATARACT EXTRACTION PHACO AND INTRAOCULAR LENS PLACEMENT (IOC);  Surgeon: Cherene Mania, MD;  Location: [facility];  Service: Ophthalmology;  Laterality: Right;  CDE:25.80   COLONOSCOPY  05/12/2011   Procedure: COLONOSCOPY;  Surgeon: Margo CHRISTELLA Harvey, MD;  Location: [facility];  Service: Endoscopy;  Laterality: N/A;  10:30AM   CYSTOSCOPY W/ STONE MANIPULATION     x3   IR NEPHROSTOMY PLACEMENT LEFT  08/15/2020   kidney stones     X 4 procedures   NEPHROLITHOTOMY Left 08/18/2020   Procedure: NEPHROLITHOTOMY PERCUTANEOUS.;  Surgeon: Sherrilee Belvie CROME, MD;  Location: [facility];  Service: Urology;  Laterality: Left;    Current Outpatient Medications  Medication Sig Dispense Refill   ALPRAZolam  (XANAX ) 0.5 MG tablet Take 0.5-1 mg by mouth 3 (three) times daily.     cholecalciferol  (VITAMIN D ) 1000 UNITS tablet Take 1,000 Units by mouth daily.     escitalopram (LEXAPRO) 10 MG tablet Take 10 mg by mouth daily.     gabapentin  (NEURONTIN ) 100 MG capsule Take 100 mg by mouth 3 (three) times daily.     hydrochlorothiazide  25 MG tablet Take 25 mg by mouth every morning.  traZODone (DESYREL) 50 MG tablet Take 50 mg by mouth at bedtime.     No current facility-administered medications for this visit.    Allergies as of 03/05/2024   (No Known Allergies)    Family History  Problem Relation Age of Onset   Colon cancer Sister        diagnosed late 3s.    Colon cancer Mother        diagnosed in mid to late 57s. 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  Tobacco Use   Smoking status: Never   Smokeless tobacco: Never  Vaping Use   Vaping status: Never Used  Substance and Sexual Activity   Alcohol use: No   Drug use: No   Sexual activity: Yes    Birth control/protection: Surgical  Other Topics Concern   Not on file  Social History Narrative   Not on file   Social Drivers of Health   Financial Resource Strain: Not on file  Food Insecurity: Not on file  Transportation Needs: Not on file  Physical Activity: Not on file  Stress: Not on file  Social Connections: Unknown (01/11/2022)   Received from [facility]   Social Network    Social Network: Not on file    Review of Systems: Gen: Denies fever, chills, cold or flulike symptoms, presyncope, syncope. CV: Denies chest pain, palpitations. Resp: Denies dyspnea, cough. GI: See HPI  Heme: See HPI  Physical Exam: BP 136/85 (BP Location: Left Arm, Patient Position: Sitting, Cuff Size: Normal)   Pulse 78   Temp 97.7 F (36.5 C) (Temporal)   Ht 5' (1.524 m)   Wt 139 lb 6.4 oz (63.2 kg)   BMI 27.22 kg/m  General:   Alert and oriented. No distress noted. Pleasant and cooperative.  Head:  Normocephalic and atraumatic. Eyes:  Conjuctiva clear without scleral icterus. Heart:  S1, S2 present without murmurs appreciated. Lungs:  Clear to auscultation bilaterally. No wheezes, rales, or rhonchi. No distress.  Abdomen:  +BS, soft,  and non-distended.  Mild TTP in LUQ region.  No rebound or guarding. No HSM or masses noted. Msk:  Symmetrical without gross deformities. Normal posture. Extremities:  Without edema. Neurologic:  Alert and  oriented x4 Psych:  Normal mood and affect.    Assessment:  88 year old female with history of anxiety, HTN, CKD, presenting today for follow-up of abdominal asymmetry, LUQ abdominal tenderness, morning nausea, and  also discuss decreased bowel frequency.  Morning nausea without vomiting: Spontaneously resolved.  Decreased bowel frequency: Has noticed decreased bowel frequency recently with skipping a day or so between bowel movements or having incomplete bowel movements.  CT A/P with contrast 6//25 showed moderate stool throughout the colon.  No improvement with MiraLAX daily.  I will try her on Linzess 145 mcg daily.  We did discuss the possibility of a colonoscopy in light of change in bowel habits and the fact that her last colonoscopy was in 2012, but patient prefers to avoid this if possible.  History of colon polyps: Last colonoscopy 05/12/2011 for personal history of colon polyps and 2 first-degree relatives with colon cancer age less than 60.  She was found to have moderate diverticulosis, small internal hemorrhoids with recommendations to repeat colonoscopy in 5-10 years.  Patient is currently declining colonoscopy unless absolutely necessary.  LUQ tenderness to palpation Etiology unclear.  Could be related to constipation as she was noted to have moderate stool burden on CT A/P with contrast 6//25.  No other acute abnormalities.  Denies pain unless palpating the area.  No postprandial symptoms and prior morning nausea issues have resolved spontaneously.  We discussed possibility of EGD to evaluate for gastritis, PUD, H. pylori as well as colonoscopy as she is overdue, but she prefers to avoid procedures.  We will focus on managing constipation.  Abdominal wall asymmetry: Upper abdomen more prominent than lower abdomen. No acute abnormalities on CT A/P with contrast 02/01/2024.  No palpable abdominal mass/lesion.  Likely benign process, possibly secondary to asymmetric deposition of adipose tissue.   Plan:  Start Linzess 145 mcg daily, 30 minutes before breakfast.  Samples provided.  Requested progress report in 1 week. Drink at least 64 ounces of water daily. Consume plenty of fruits, vegetables,  whole grains to maintain adequate fiber intake. Follow-up in 6 weeks or sooner if needed.   Josette Centers, PA-C [facility] 03/05/2024

## 2024-03-05 ENCOUNTER — Ambulatory Visit: Admitting: Gastroenterology

## 2024-03-05 ENCOUNTER — Encounter: Payer: Self-pay | Admitting: Gastroenterology

## 2024-03-05 VITALS — BP 136/85 | HR 78 | Temp 97.7°F | Ht 60.0 in | Wt 139.4 lb

## 2024-03-05 DIAGNOSIS — R110 Nausea: Secondary | ICD-10-CM

## 2024-03-05 DIAGNOSIS — R10812 Left upper quadrant abdominal tenderness: Secondary | ICD-10-CM

## 2024-03-05 DIAGNOSIS — Z860100 Personal history of colon polyps, unspecified: Secondary | ICD-10-CM | POA: Diagnosis not present

## 2024-03-05 DIAGNOSIS — R194 Change in bowel habit: Secondary | ICD-10-CM | POA: Diagnosis not present

## 2024-03-05 DIAGNOSIS — R198 Other specified symptoms and signs involving the digestive system and abdomen: Secondary | ICD-10-CM | POA: Diagnosis not present

## 2024-03-05 DIAGNOSIS — Z800 Family history of malignant neoplasm of digestive organs: Secondary | ICD-10-CM | POA: Diagnosis not present

## 2024-03-05 NOTE — Patient Instructions (Addendum)
 Start Linzess 145 mcg daily, 30 minutes before breakfast for constipation. We are providing you with samples today. Please call with a progress report in 1 week.   Try to drink at least 64 oz of water daily.   Consume plenty of fruits, vegetables, and whole grains to maintain adequate fiber intake.   I will see you back in 6 weeks or sooner if needed.   Josette Centers, PA-C [facility]

## 2024-05-14 DIAGNOSIS — Z87442 Personal history of urinary calculi: Secondary | ICD-10-CM | POA: Diagnosis not present

## 2024-05-14 DIAGNOSIS — M1990 Unspecified osteoarthritis, unspecified site: Secondary | ICD-10-CM | POA: Diagnosis not present

## 2024-05-14 DIAGNOSIS — M47819 Spondylosis without myelopathy or radiculopathy, site unspecified: Secondary | ICD-10-CM | POA: Diagnosis not present

## 2024-05-14 DIAGNOSIS — Z7689 Persons encountering health services in other specified circumstances: Secondary | ICD-10-CM | POA: Diagnosis not present

## 2024-05-14 DIAGNOSIS — F5104 Psychophysiologic insomnia: Secondary | ICD-10-CM | POA: Diagnosis not present

## 2024-05-14 DIAGNOSIS — F411 Generalized anxiety disorder: Secondary | ICD-10-CM | POA: Diagnosis not present

## 2024-05-14 DIAGNOSIS — I10 Essential (primary) hypertension: Secondary | ICD-10-CM | POA: Diagnosis not present

## 2024-05-29 DIAGNOSIS — I10 Essential (primary) hypertension: Secondary | ICD-10-CM | POA: Diagnosis not present

## 2024-08-08 ENCOUNTER — Encounter: Payer: Self-pay | Admitting: Gastroenterology

## 2024-09-10 ENCOUNTER — Ambulatory Visit (HOSPITAL_COMMUNITY): Admission: RE | Admit: 2024-09-10 | Payer: Medicare HMO | Source: Ambulatory Visit

## 2024-09-17 NOTE — Addendum Note (Signed)
 Addended by: ANN VELERIA SAUNDERS on: 09/17/2024 01:32 PM   Modules accepted: Orders

## 2024-09-24 ENCOUNTER — Ambulatory Visit: Payer: Medicare HMO | Admitting: Urology

## 2024-09-26 ENCOUNTER — Ambulatory Visit (HOSPITAL_COMMUNITY)

## 2024-10-01 ENCOUNTER — Ambulatory Visit (HOSPITAL_COMMUNITY)

## 2024-10-08 ENCOUNTER — Ambulatory Visit: Admitting: Gastroenterology

## 2024-10-08 ENCOUNTER — Ambulatory Visit (HOSPITAL_COMMUNITY)

## 2025-01-18 ENCOUNTER — Ambulatory Visit: Admitting: Urology
